# Patient Record
Sex: Female | Born: 1950 | Race: White | Hispanic: No | Marital: Married | Smoking: Never smoker
Health system: Southern US, Community
[De-identification: ages and names within clinical notes are randomized; demographics above are authoritative.]

## PROBLEM LIST (undated history)

## (undated) DIAGNOSIS — J302 Other seasonal allergic rhinitis: Secondary | ICD-10-CM

## (undated) DIAGNOSIS — T4145XA Adverse effect of unspecified anesthetic, initial encounter: Secondary | ICD-10-CM

## (undated) DIAGNOSIS — M1990 Unspecified osteoarthritis, unspecified site: Secondary | ICD-10-CM

## (undated) DIAGNOSIS — R51 Headache: Secondary | ICD-10-CM

## (undated) DIAGNOSIS — F329 Major depressive disorder, single episode, unspecified: Secondary | ICD-10-CM

## (undated) DIAGNOSIS — T8859XA Other complications of anesthesia, initial encounter: Secondary | ICD-10-CM

## (undated) DIAGNOSIS — F32A Depression, unspecified: Secondary | ICD-10-CM

## (undated) DIAGNOSIS — F419 Anxiety disorder, unspecified: Secondary | ICD-10-CM

## (undated) HISTORY — PX: BLADDER SURGERY: SHX569

## (undated) HISTORY — PX: FRACTURE SURGERY: SHX138

## (undated) HISTORY — PX: DIAGNOSTIC LAPAROSCOPY: SUR761

---
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
## 1996-02-27 HISTORY — PX: ABDOMINAL HYSTERECTOMY: SHX81

## 1997-02-26 HISTORY — PX: OVARY SURGERY: SHX727

## 1999-08-01 ENCOUNTER — Observation Stay (HOSPITAL_COMMUNITY): Admission: RE | Admit: 1999-08-01 | Discharge: 1999-08-02 | Payer: Self-pay | Admitting: Urology

## 2000-02-27 HISTORY — PX: EYE SURGERY: SHX253

## 2000-08-21 ENCOUNTER — Other Ambulatory Visit: Admission: RE | Admit: 2000-08-21 | Discharge: 2000-08-21 | Payer: Self-pay | Admitting: Obstetrics and Gynecology

## 2001-08-26 ENCOUNTER — Other Ambulatory Visit: Admission: RE | Admit: 2001-08-26 | Discharge: 2001-08-26 | Payer: Self-pay | Admitting: Obstetrics and Gynecology

## 2002-10-06 ENCOUNTER — Other Ambulatory Visit: Admission: RE | Admit: 2002-10-06 | Discharge: 2002-10-06 | Payer: Self-pay | Admitting: Obstetrics and Gynecology

## 2003-10-28 ENCOUNTER — Other Ambulatory Visit: Admission: RE | Admit: 2003-10-28 | Discharge: 2003-10-28 | Payer: Self-pay | Admitting: Obstetrics and Gynecology

## 2005-02-06 ENCOUNTER — Other Ambulatory Visit: Admission: RE | Admit: 2005-02-06 | Discharge: 2005-02-06 | Payer: Self-pay | Admitting: Obstetrics and Gynecology

## 2008-02-27 HISTORY — PX: EYE SURGERY: SHX253

## 2008-08-28 ENCOUNTER — Inpatient Hospital Stay (HOSPITAL_COMMUNITY): Admission: EM | Admit: 2008-08-28 | Discharge: 2008-09-02 | Payer: Self-pay | Admitting: Emergency Medicine

## 2008-09-15 ENCOUNTER — Ambulatory Visit (HOSPITAL_COMMUNITY): Admission: RE | Admit: 2008-09-15 | Discharge: 2008-09-16 | Payer: Self-pay | Admitting: Orthopedic Surgery

## 2010-06-04 LAB — CBC
HCT: 31.9 % — ABNORMAL LOW (ref 36.0–46.0)
Hemoglobin: 10.7 g/dL — ABNORMAL LOW (ref 12.0–15.0)
MCHC: 33.7 g/dL (ref 30.0–36.0)
MCV: 91.4 fL (ref 78.0–100.0)
Platelets: 384 10*3/uL (ref 150–400)
RBC: 3.48 MIL/uL — ABNORMAL LOW (ref 3.87–5.11)
RDW: 13.6 % (ref 11.5–15.5)
WBC: 4.4 10*3/uL (ref 4.0–10.5)

## 2010-06-04 LAB — BASIC METABOLIC PANEL
BUN: 14 mg/dL (ref 6–23)
CO2: 26 mEq/L (ref 19–32)
Calcium: 9.2 mg/dL (ref 8.4–10.5)
Chloride: 109 mEq/L (ref 96–112)
Creatinine, Ser: 0.56 mg/dL (ref 0.4–1.2)
GFR calc Af Amer: >60 mL/min (ref >60)
GFR calc non Af Amer: >60 mL/min (ref >60)
Glucose, Bld: 105 mg/dL — ABNORMAL HIGH (ref 70–99)
Potassium: 4.2 mEq/L (ref 3.5–5.1)
Sodium: 142 mEq/L (ref 135–145)

## 2010-06-05 LAB — BASIC METABOLIC PANEL
BUN: 16 mg/dL (ref 6–23)
BUN: 19 mg/dL (ref 6–23)
BUN: 3 mg/dL — ABNORMAL LOW (ref 6–23)
CO2: 21 mEq/L (ref 19–32)
CO2: 27 mEq/L (ref 19–32)
CO2: 32 mEq/L (ref 19–32)
Calcium: 8.1 mg/dL — ABNORMAL LOW (ref 8.4–10.5)
Calcium: 8.5 mg/dL (ref 8.4–10.5)
Calcium: 9.1 mg/dL (ref 8.4–10.5)
Chloride: 105 mEq/L (ref 96–112)
Chloride: 110 mEq/L (ref 96–112)
Chloride: 110 mEq/L (ref 96–112)
Creatinine, Ser: 0.53 mg/dL (ref 0.4–1.2)
Creatinine, Ser: 0.89 mg/dL (ref 0.4–1.2)
Creatinine, Ser: 1.05 mg/dL (ref 0.4–1.2)
GFR calc Af Amer: >60 mL/min (ref >60)
GFR calc Af Amer: >60 mL/min (ref >60)
GFR calc Af Amer: >60 mL/min (ref >60)
GFR calc non Af Amer: 54 mL/min — ABNORMAL LOW (ref >60)
GFR calc non Af Amer: >60 mL/min (ref >60)
GFR calc non Af Amer: >60 mL/min (ref >60)
Glucose, Bld: 113 mg/dL — ABNORMAL HIGH (ref 70–99)
Glucose, Bld: 142 mg/dL — ABNORMAL HIGH (ref 70–99)
Glucose, Bld: 212 mg/dL — ABNORMAL HIGH (ref 70–99)
Potassium: 3.6 mEq/L (ref 3.5–5.1)
Potassium: 4.1 mEq/L (ref 3.5–5.1)
Potassium: 4.3 mEq/L (ref 3.5–5.1)
Sodium: 141 mEq/L (ref 135–145)
Sodium: 145 mEq/L (ref 135–145)
Sodium: 145 mEq/L (ref 135–145)

## 2010-06-05 LAB — COMPREHENSIVE METABOLIC PANEL
ALT: 181 U/L — ABNORMAL HIGH (ref 0–35)
AST: 181 U/L — ABNORMAL HIGH (ref 0–37)
Albumin: 3.4 g/dL — ABNORMAL LOW (ref 3.5–5.2)
Alkaline Phosphatase: 48 U/L (ref 39–117)
BUN: 7 mg/dL (ref 6–23)
CO2: 31 mEq/L (ref 19–32)
Calcium: 7.9 mg/dL — ABNORMAL LOW (ref 8.4–10.5)
Chloride: 109 mEq/L (ref 96–112)
Creatinine, Ser: 0.57 mg/dL (ref 0.4–1.2)
GFR calc Af Amer: >60 mL/min (ref >60)
GFR calc non Af Amer: >60 mL/min (ref >60)
Glucose, Bld: 127 mg/dL — ABNORMAL HIGH (ref 70–99)
Potassium: 4 mEq/L (ref 3.5–5.1)
Sodium: 142 mEq/L (ref 135–145)
Total Bilirubin: 0.9 mg/dL (ref 0.3–1.2)
Total Protein: 5.9 g/dL — ABNORMAL LOW (ref 6.0–8.3)

## 2010-06-05 LAB — CBC
HCT: 22.9 % — ABNORMAL LOW (ref 36.0–46.0)
HCT: 23.6 % — ABNORMAL LOW (ref 36.0–46.0)
HCT: 24.5 % — ABNORMAL LOW (ref 36.0–46.0)
HCT: 25 % — ABNORMAL LOW (ref 36.0–46.0)
HCT: 25 % — ABNORMAL LOW (ref 36.0–46.0)
HCT: 25.3 % — ABNORMAL LOW (ref 36.0–46.0)
HCT: 26.3 % — ABNORMAL LOW (ref 36.0–46.0)
HCT: 27.5 % — ABNORMAL LOW (ref 36.0–46.0)
HCT: 27.9 % — ABNORMAL LOW (ref 36.0–46.0)
HCT: 28.4 % — ABNORMAL LOW (ref 36.0–46.0)
HCT: 28.5 % — ABNORMAL LOW (ref 36.0–46.0)
HCT: 29.5 % — ABNORMAL LOW (ref 36.0–46.0)
HCT: 35.8 % — ABNORMAL LOW (ref 36.0–46.0)
Hemoglobin: 10.1 g/dL — ABNORMAL LOW (ref 12.0–15.0)
Hemoglobin: 12.2 g/dL (ref 12.0–15.0)
Hemoglobin: 8 g/dL — ABNORMAL LOW (ref 12.0–15.0)
Hemoglobin: 8.3 g/dL — ABNORMAL LOW (ref 12.0–15.0)
Hemoglobin: 8.5 g/dL — ABNORMAL LOW (ref 12.0–15.0)
Hemoglobin: 8.5 g/dL — ABNORMAL LOW (ref 12.0–15.0)
Hemoglobin: 8.6 g/dL — ABNORMAL LOW (ref 12.0–15.0)
Hemoglobin: 8.8 g/dL — ABNORMAL LOW (ref 12.0–15.0)
Hemoglobin: 9.1 g/dL — ABNORMAL LOW (ref 12.0–15.0)
Hemoglobin: 9.4 g/dL — ABNORMAL LOW (ref 12.0–15.0)
Hemoglobin: 9.5 g/dL — ABNORMAL LOW (ref 12.0–15.0)
Hemoglobin: 9.6 g/dL — ABNORMAL LOW (ref 12.0–15.0)
Hemoglobin: 9.8 g/dL — ABNORMAL LOW (ref 12.0–15.0)
MCHC: 33.4 g/dL (ref 30.0–36.0)
MCHC: 33.6 g/dL (ref 30.0–36.0)
MCHC: 33.8 g/dL (ref 30.0–36.0)
MCHC: 34.1 g/dL (ref 30.0–36.0)
MCHC: 34.2 g/dL (ref 30.0–36.0)
MCHC: 34.5 g/dL (ref 30.0–36.0)
MCHC: 34.6 g/dL (ref 30.0–36.0)
MCHC: 34.6 g/dL (ref 30.0–36.0)
MCHC: 34.7 g/dL (ref 30.0–36.0)
MCHC: 34.8 g/dL (ref 30.0–36.0)
MCHC: 34.8 g/dL (ref 30.0–36.0)
MCHC: 35.1 g/dL (ref 30.0–36.0)
MCHC: 35.3 g/dL (ref 30.0–36.0)
MCV: 91 fL (ref 78.0–100.0)
MCV: 92 fL (ref 78.0–100.0)
MCV: 92.2 fL (ref 78.0–100.0)
MCV: 92.3 fL (ref 78.0–100.0)
MCV: 92.3 fL (ref 78.0–100.0)
MCV: 92.4 fL (ref 78.0–100.0)
MCV: 92.5 fL (ref 78.0–100.0)
MCV: 92.6 fL (ref 78.0–100.0)
MCV: 92.6 fL (ref 78.0–100.0)
MCV: 92.7 fL (ref 78.0–100.0)
MCV: 92.8 fL (ref 78.0–100.0)
MCV: 92.8 fL (ref 78.0–100.0)
MCV: 93.1 fL (ref 78.0–100.0)
Platelets: 121 10*3/uL — ABNORMAL LOW (ref 150–400)
Platelets: 140 10*3/uL — ABNORMAL LOW (ref 150–400)
Platelets: 141 10*3/uL — ABNORMAL LOW (ref 150–400)
Platelets: 144 10*3/uL — ABNORMAL LOW (ref 150–400)
Platelets: 145 10*3/uL — ABNORMAL LOW (ref 150–400)
Platelets: 162 10*3/uL (ref 150–400)
Platelets: 200 10*3/uL (ref 150–400)
Platelets: 206 10*3/uL (ref 150–400)
Platelets: 212 10*3/uL (ref 150–400)
Platelets: 219 10*3/uL (ref 150–400)
Platelets: 225 10*3/uL (ref 150–400)
Platelets: 236 10*3/uL (ref 150–400)
Platelets: DECREASED 10*3/uL (ref 150–400)
RBC: 2.48 MIL/uL — ABNORMAL LOW (ref 3.87–5.11)
RBC: 2.56 MIL/uL — ABNORMAL LOW (ref 3.87–5.11)
RBC: 2.64 MIL/uL — ABNORMAL LOW (ref 3.87–5.11)
RBC: 2.71 MIL/uL — ABNORMAL LOW (ref 3.87–5.11)
RBC: 2.71 MIL/uL — ABNORMAL LOW (ref 3.87–5.11)
RBC: 2.73 MIL/uL — ABNORMAL LOW (ref 3.87–5.11)
RBC: 2.83 MIL/uL — ABNORMAL LOW (ref 3.87–5.11)
RBC: 2.98 MIL/uL — ABNORMAL LOW (ref 3.87–5.11)
RBC: 3.02 MIL/uL — ABNORMAL LOW (ref 3.87–5.11)
RBC: 3.06 MIL/uL — ABNORMAL LOW (ref 3.87–5.11)
RBC: 3.12 MIL/uL — ABNORMAL LOW (ref 3.87–5.11)
RBC: 3.2 MIL/uL — ABNORMAL LOW (ref 3.87–5.11)
RBC: 3.86 MIL/uL — ABNORMAL LOW (ref 3.87–5.11)
RDW: 12.8 % (ref 11.5–15.5)
RDW: 12.8 % (ref 11.5–15.5)
RDW: 12.8 % (ref 11.5–15.5)
RDW: 12.8 % (ref 11.5–15.5)
RDW: 13 % (ref 11.5–15.5)
RDW: 13 % (ref 11.5–15.5)
RDW: 13 % (ref 11.5–15.5)
RDW: 13.1 % (ref 11.5–15.5)
RDW: 13.1 % (ref 11.5–15.5)
RDW: 13.2 % (ref 11.5–15.5)
RDW: 13.4 % (ref 11.5–15.5)
RDW: 13.5 % (ref 11.5–15.5)
RDW: 13.6 % (ref 11.5–15.5)
WBC: 14.2 10*3/uL — ABNORMAL HIGH (ref 4.0–10.5)
WBC: 5 10*3/uL (ref 4.0–10.5)
WBC: 5.2 10*3/uL (ref 4.0–10.5)
WBC: 5.5 10*3/uL (ref 4.0–10.5)
WBC: 5.6 10*3/uL (ref 4.0–10.5)
WBC: 5.8 10*3/uL (ref 4.0–10.5)
WBC: 5.9 10*3/uL (ref 4.0–10.5)
WBC: 6.2 10*3/uL (ref 4.0–10.5)
WBC: 7.2 10*3/uL (ref 4.0–10.5)
WBC: 7.3 10*3/uL (ref 4.0–10.5)
WBC: 7.3 10*3/uL (ref 4.0–10.5)
WBC: 7.6 10*3/uL (ref 4.0–10.5)
WBC: 8 10*3/uL (ref 4.0–10.5)

## 2010-06-05 LAB — URINALYSIS, ROUTINE W REFLEX MICROSCOPIC
Bilirubin Urine: NEGATIVE
Glucose, UA: NEGATIVE mg/dL
Ketones, ur: 15 mg/dL — AB
Nitrite: POSITIVE — AB
Protein, ur: 30 mg/dL — AB
Specific Gravity, Urine: 1.014 (ref 1.005–1.030)
Urobilinogen, UA: 0.2 mg/dL (ref 0.0–1.0)
pH: 5.5 (ref 5.0–8.0)

## 2010-06-05 LAB — HEPATIC FUNCTION PANEL
ALT: 317 U/L — ABNORMAL HIGH (ref 0–35)
AST: 597 U/L — ABNORMAL HIGH (ref 0–37)
Albumin: 3.8 g/dL (ref 3.5–5.2)
Alkaline Phosphatase: 56 U/L (ref 39–117)
Bilirubin, Direct: 0.3 mg/dL (ref 0.0–0.3)
Indirect Bilirubin: 0.3 mg/dL (ref 0.3–0.9)
Total Bilirubin: 0.6 mg/dL (ref 0.3–1.2)
Total Protein: 6.1 g/dL (ref 6.0–8.3)

## 2010-06-05 LAB — TYPE AND SCREEN
ABO/RH(D): A POS
Antibody Screen: NEGATIVE

## 2010-06-05 LAB — DIFFERENTIAL
Basophils Absolute: 0 10*3/uL (ref 0.0–0.1)
Basophils Relative: 0 % (ref 0–1)
Eosinophils Absolute: 0 10*3/uL (ref 0.0–0.7)
Eosinophils Relative: 0 % (ref 0–5)
Lymphocytes Relative: 7 % — ABNORMAL LOW (ref 12–46)
Lymphs Abs: 1 10*3/uL (ref 0.7–4.0)
Monocytes Absolute: 0.7 10*3/uL (ref 0.1–1.0)
Monocytes Relative: 5 % (ref 3–12)
Neutro Abs: 12.5 10*3/uL — ABNORMAL HIGH (ref 1.7–7.7)
Neutrophils Relative %: 88 % — ABNORMAL HIGH (ref 43–77)

## 2010-06-05 LAB — ETHANOL: Alcohol, Ethyl (B): 66 mg/dL — ABNORMAL HIGH (ref 0–10)

## 2010-06-05 LAB — PROTIME-INR
INR: 1 (ref 0.00–1.49)
Prothrombin Time: 13.6 seconds (ref 11.6–15.2)

## 2010-06-05 LAB — URINE MICROSCOPIC-ADD ON

## 2010-06-05 LAB — APTT: aPTT: <20 seconds — ABNORMAL LOW (ref 24–37)

## 2010-06-05 LAB — ACETAMINOPHEN LEVEL: Acetaminophen (Tylenol), Serum: <10 ug/mL — ABNORMAL LOW (ref 10–30)

## 2010-06-05 LAB — ABO/RH: ABO/RH(D): A POS
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
## 2010-07-11 NOTE — Op Note (Signed)
NAMEARANTZA, DARRINGTON               ACCOUNT NO.:  [id number]   MEDICAL RECORD NO.:  [id number]          PATIENT TYPE:  OIB   LOCATION:  3039                         FACILITY:  [facility]   PHYSICIAN:  Harvie Junior, M.D.   DATE OF BIRTH:  May 02, 1950   DATE OF PROCEDURE:  DATE OF DISCHARGE:  09/16/2008                               OPERATIVE REPORT   PREOPERATIVE DIAGNOSIS:  Type II distal clavicle fracture  __________disruption of the __________ clavicular ligament.   POSTOPERATIVE DIAGNOSIS:  Type II distal clavicle fracture __________  disruption of the __________ clavicular ligament.   __________ repair of the type II distal clavicle fracture by performance  of a distal clavicle resection and repair of the clavicle down to the  coracoid with __________ x2 on the __________ .   SURGEON:  Harvie Junior, MD   ANESTHESIA:  General.   HISTORY:  Ms. Ashley Kaufman is a 60 year old female had a injury about 2 weeks  ago falling down the stairs.  She suffered a type II distal clavicle  fracture.  She presents to our office, was initially found that  __________ at the acromioclavicular space __________.   PROCEDURE:  The patient was brought to the operating room.  After  adequate anesthesia was obtained with general anesthetic, the patient  was placed supine on the operating table and moved to the beach chair  position __________ scapula.  Attention was turned to the right shoulder  after prep and drape.  A curved incision was made __________ surrounding  the coracoid  __________.  At this point, we irrigated thoroughly  __________ this area and then closed the skin with 2-0 Vicryl and 3-0  Monocryl subcuticular.  Benzoin and Steri-Strips were applied.  Sterile  compression dressing was applied.  The patient was taken to the recovery  room __________.      Harvie Junior, M.D.     Ranae Plumber  D:  09/16/2008  T:  09/17/2008  Job:  [id number]
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
## 2010-07-11 NOTE — Op Note (Signed)
Ashley Kaufman, Ashley Kaufman               ACCOUNT NO.:  [id number]   MEDICAL RECORD NO.:  [id number]          PATIENT TYPE:  OIB   LOCATION:  3039                         FACILITY:  [facility]   PHYSICIAN:  Harvie Junior, M.D.   DATE OF BIRTH:  27-Mar-1950   DATE OF PROCEDURE:  09/15/2008  DATE OF DISCHARGE:  09/16/2008                               OPERATIVE REPORT   SURGEON:  Harvie Junior, MD   ASSISTANT:  Marshia Ly, PA   BRIEF HISTORY:  Ashley Kaufman is a 60 year old female with a long history  of having suffered an injury where she had fallen down the stairs.  She  suffered a fracture dislocation of her right shoulder with type 2 distal  clavicle fracture with dislocation of the AC joint.  She also suffered  bilateral wrist fractures.  We had addressed the right wrist fracture  with surgical intervention as outlined on the previous operative noted  and she was brought to the operating room for open reduction and  internal fixation of her distal clavicle fracture abd repair of AC  joint.   PROCEDURE:  The patient was taken to the operating room.  After adequate  anesthesia was obtained with general anesthetic, the patient was placed  supine on the operating table.  The right shoulder was then prepped and  draped in the usual sterile fashion.  Following this, a curved incision  was made in the skin lines and subcutaneous tissues were taken down the  level of the deltotrapezial fascia and this was divided in line with the  clavicle.  We dissected down to the fractured fragments and did a distal  clavicle excision by removal of all the remaining fractured fragments.  Following this, attention was turned towards the distal clavicle, which  was popped up quite significantly and fractured clavicle was identified  and noted to really be in a coronal planes, so there was really not a  bicortical place where we could get point of fixation of further distal  clavicle and because of this concern  we ultimately went in and passed  two #5 sutures around the coracoid and once this was done through two  different drill holes we passed #5 sutures through there.  This gave  excellent fixation of the clavicle, but because of the concerns of  inability to repair of the corporal clavicular ligaments, because of the  fracture fragment, and there being no bile ligamentous attachment to the  clavicle proper, we felt that we needed to do some sort of ligamentous  transfer.  At this point, we took down the coracoacromial ligament and  transferred that into the end of the clavicle through a #2 FiberWire  through 2 drill holes and entered the clavicle.  This gave excellent  fixation of this ligament and was quite a strong ligament to hold this  in place.  At this point, the clavicle was nicely reduced to the  coracoid.  The coracoacromial ligament had been restored into the end.  All bony fragment had been removed.  The wound was irrigated and  suctioned dry.  The  deltotrapezial fascia was closed with 1 Vicryl  running, skin with 1-0 and 2-0 Vicryl, 3-0 Monocryl subcuticular.  Benzoin and Steri-Strips were applied.  Sterile compressive dressing was  applied in an arm sling, and the patient was taken to the recovery room.  She was noted to be in satisfactory condition.  Estimated blood loss for  the procedure was none.      Harvie Junior, M.D.  Electronically Signed     JLG/MEDQ  D:  09/20/2008  T:  09/20/2008  Job:  [id number]

## 2010-07-11 NOTE — Op Note (Signed)
NAMEYARIELA, Ashley Kaufman               ACCOUNT NO.:  [id number]   MEDICAL RECORD NO.:  [id number]          PATIENT TYPE:  INP   LOCATION:  5022                         FACILITY:  [facility]   PHYSICIAN:  Johnette Abraham, MD    DATE OF BIRTH:  04/14/1950   DATE OF PROCEDURE:  08/29/2008  DATE OF DISCHARGE:                               OPERATIVE REPORT   PREOPERATIVE DIAGNOSIS:  Bilateral distal radius fractures.   POSTOPERATIVE DIAGNOSIS:  Bilateral distal radius fractures.   PROCEDURES:  1. Open reduction and internal fixation of the left distal radius with      Stryker volar plate.  2. Closed reduction and splinting of the right distal radius.   SURGEON:  Johnette Abraham, MD   ASSISTANT:  None.   ANESTHESIA:  General.   SPECIMENS:  None.   ESTIMATED BLOOD LOSS:  Minimal.   COMPLICATIONS:  No acute complications.   INDICATIONS:  [name] is a young female, who fell down some steps  the other day, sustaining bilateral distal radius fractures.  The left  was the more severe of the two with dorsal angulation.  The risks,  benefits, and alternatives of the surgery were discussed with the  patient and she agreed to proceed with the above-mentioned procedures.  Consent was obtained.   PROCEDURE IN DETAIL:  The patient was taken to the operating room and  placed supine on the operating room table.  General anesthesia was  administered.  All extremities were padded.  The left upper extremity  was prepped and draped in normal sterile fashion.  An Esmarch was used.  Tourniquet was inflated to 250 mmHg.  A volar incision was made  overlying the FCR tendon.  Dissection was carried down through the deep  fascia to the pronator quadratus muscle.  This muscle was taken down in  an L-shaped fashion exposing the fracture site.  The fracture site was  extra-articular, was easily reduced.  An appropriate size Stryker volar  plate was chosen, temporarily held in place with K-wires while x-ray  examination revealed a near anatomic reduction and good plate placement.  The radial shaft screws were drilled first.  A total of 4 screws in the  radial shaft were each drilled, measured to length, and placed.  These  were a combination of cortical and locking screws.  Following, the  radial styloid screws were drilled and placed.  X-ray examination  revealed good and near anatomic reduction, and therefore the remaining  screws were drilled, measured, and placed.  The most ulnar and distal  screw had to be changed out and redrilled due to wrong projection.  However, the final screw placement and x-ray revealed near anatomic  reduction and good plate and screw length and placement.  The wound was  irrigated with irrigation solution.  The pronator quadratus muscle was  approximated over the plate.  The deep fascia was then closed, all these  with interrupted 4-0 Vicryl.  The tourniquet was released.  All fingers  returned to nice pink color.  The skin was closed with a running 4-0  Monocryl stitch.  Xeroform and a splint were placed.  Following this,  the right extremity was addressed.  There was a slight dorsal  displacement of this fracture.  Gentle dorsal pressure reduced the  fracture nicely.  A splint was placed and the reduction was maintained  after splint placement.  The patient tolerated the procedure well,  awakened from anesthesia without difficulties.      Johnette Abraham, MD  Electronically Signed     HCC/MEDQ  D:  08/29/2008  T:  08/29/2008  Job:  [id number]

## 2010-07-11 NOTE — Op Note (Signed)
Ashley Kaufman, Ashley Kaufman               ACCOUNT NO.:  [id number]   MEDICAL RECORD NO.:  [id number]          PATIENT TYPE:  OIB   LOCATION:  3039                         FACILITY:  [facility]   PHYSICIAN:  Harvie Junior, M.D.   DATE OF BIRTH:  03/07/1950   DATE OF PROCEDURE:  DATE OF DISCHARGE:  09/16/2008                               OPERATIVE REPORT   PREOPERATIVE DIAGNOSIS:  Malunion, right distal radius.   PREOPERATIVE DIAGNOSIS:  Malunion right distal radius.   PROCEDURE:  Open reduction and internal fixation of right distal radius  malunion with a DVR plate.   SURGEON:  Harvie Junior, MD.   ASSISTANT:  Marshia Ly, P.A.   ANESTHESIA:  General.   BRIEF HISTORY:  [name] is a 60 year old female with a history of  having had a fall and suffered bilateral wrist fracture and fracture  dislocation of shoulder.,  she was treated with _closed reduction r. and  plating l.  Ultimately she came to [place] for fixation of the shoulder and  we evaluated the r. wrist fracture and found this to have a malunion and  i felt this needed to be fixed and she taken to the operating room for  fixation of this malunion.   PROCEDURE:  The patient was taken to the operating room.  After adequate  anesthesia obtained with general anesthetic, the patient was placed  supine on the operating table.  The right wrist was then prepped and  draped in usual sterile fashion.  Following this, the arm was  exsanguinated, the blood pressure tourniquet was inflated to 250 mmHg.  A curved incision was made over the flexor carpi radialis tendon. After  incision the tendon was retracted radially from the forearm and the  pronator quadratus was taken down on the radial side and the fracture  malunion was exposed.  The fracture site was identified.  The malunion  was identified and reduced. the wrist was fixed with a DVR plate fixed  in place with 7 distal pegs and proximally with 4 screws.  We achieved a  near  anatomic reduction.  The wound was irrigated and final fluoro  images were obtained and the pronator quadratus was closed with 4-0  vicryl.  The skin was closed with 4-0 Vicryl and 3-0 Monocryl  subcuticular.  Benzoin and Steri-Strips were applied. A sterile dressing  and volar splint were applied and the patient was taken to the recovery  room and was noted to be in satisfactory condition.  Estimated blood  loss for this procedure was none.      Harvie Junior, M.D.  Electronically Signed     JLG/MEDQ  D:  09/16/2008  T:  09/17/2008  Job:  [id number]   cc:   Harvie Junior, M.D.

## 2010-07-14 NOTE — Op Note (Signed)
[facility]  Patient:    Ashley Kaufman, Ashley Kaufman                      MRN: [id number] Proc. Date: 08/01/99 Adm. Date:  [id number] Disc. Date: [id number] Attending:  Evlyn Clines CC:         Guy Sandifer. Arleta Creek, M.D.             Excell Seltzer. Annabell Howells, M.D.             Soyla Murphy. Renne Crigler, M.D.                           Operative Report  PROCEDURE:  Pubovaginal sling procedure.  PREOPERATIVE DIAGNOSIS:  Type 3 stress incontinence.  POSTOPERATIVE DIAGNOSES:  Type 3 stress incontinence, with small cystocele.  SURGEON:  Excell Seltzer. Annabell Howells, M.D.  ANESTHESIA:  General.  DRAINS:  Suprapubic tube.  COMPLICATIONS:  None.  INDICATIONS:  Ashley Kaufman is a 60 year old white female sent by Dr. Huntley Dec for incontinence.  On evaluation, she was felt to have type 3 stress incontinence with minimal hypermobility, and after discussing the treatment options, she elected pubovaginal sling.  FINDINGS AND PROCEDURE:  The patient was given p.o. Cipro.  She was taken to the operating room, where a general anesthetic was induced.  She was placed in the lithotomy position, her mons was shaved.  She was prepped with Betadine solution and draped in the usual sterile fashion.  A 16 French Foley catheter was placed per urethra, and a weighted vaginal retractor was placed.  The anterior vaginal wall was infiltrated with 1% lidocaine with epinephrine.  A 2-3 cm transverse incision was made over the pubis with a knife.  Bovie was used for hemostasis, and the fat was spread with the Strully scissors down to the fascia.  An antibiotic-soaked sponge was placed in the wound.  A midline anterior vaginal wall incision was made over the bladder neck and urethra with a weighted vaginal speculum, and the patient was noted to have more cystocele than had originally been appreciated.  The vaginal wall mucosa was elevated off the pubourethral and pubovesical fascia.  The vaginal mucosa was quite friable,  pale, and tore easily, indicating significant atrophy.  Once the mucosa had been elevated on each side back to the attachment of the fascia to the pubis, the fascia was entered on the right with the Strully scissors, and a finger was used to create a tract into the retropubic space.  This was then repeated on the left.  A 2 x 8 cm _____ strip which had been soaked in antibiotic solution was then brought onto the field.  A #1 Prolene was placed at each end with a quadruple helical stitch.  Two passes of the Raz needle were then made, one on the right and one on the left, under digital guidance, and the suspension sutures were drawn into the abdominal incision.  Once the suspension sutures were placed, a 2-0 Vicryl tacking suture was placed in the midline of the _____ graft on the distal edge.  This was secured over the midurethral level.  A second tacking stitch was placed proximally over the bladder neck.  Once the strip was in position, there was still a bit of cystocele bulging proximal to this.  The vaginal mucosa was then elevated further off the pubovesical fascia.  The pubovesical fascia was then imbricated with a horizontal 2-0 Vicryl to  reduce the remaining cystocele.  At this point, the vaginal mucosa was closed using running locked 2-0 Vicryl. The closure had a T configuration because of the tear on the left aspect of the mucosa due to the significant atrophy and friability, but I was able to obtain a good, secure closure.  Once the closure had been performed, the Foley catheter was removed and the cystoscope was passed.  Inspection revealed no evidence of suture passed through the bladder, bladder trauma, or other abnormalities.  The sling material appeared to be in good position.  The bladder was filled, the patient was placed in Trendelenburg position, and a Microvasive fader tip suprapubic tube was placed through a separate stab wound approximately 3-4 cm superior to the  abdominal incision.  Once the tube was in good position, the trocar was removed, the _____ was formed and secured, and the tube was placed to straight drainage.  At this point, the bladder was drained, the cystoscope sheath was left in the bladder to provide a neutral angle for the urethra, and the suspension sutures were then tied over a hemostat to avoid excessive tension and then across the midline to each other. The long ends were cut.  The knot was tucked back into the abdominal incision, which was then irrigated with antibiotic solution.  The abdominal incision was closed using a running intracuticular 4-0 Vicryl stitch.  The wound was reinforced with Steri-Strips.  The suprapubic tube was secured with a 0 silk suture, and a two-inch Iodoform vaginal pack was placed.  A dressing was applied to the abdominal wound.  The patient was taken down from lithotomy position, her anesthetic was reversed, and she was moved to the recovery room in stable condition.  There were no complications. DD:  08/01/99 TD:  08/03/99 Job: [id number]

## 2010-08-13 IMAGING — CR DG CHEST 1V PORT
1 series · 1 of 1 positions shown · non-contrast
Comparison: Chest x-ray of 08/28/2008

CLINICAL DATA: Fell with rib fractures

PORTABLE CHEST - 1 VIEW

[AP]
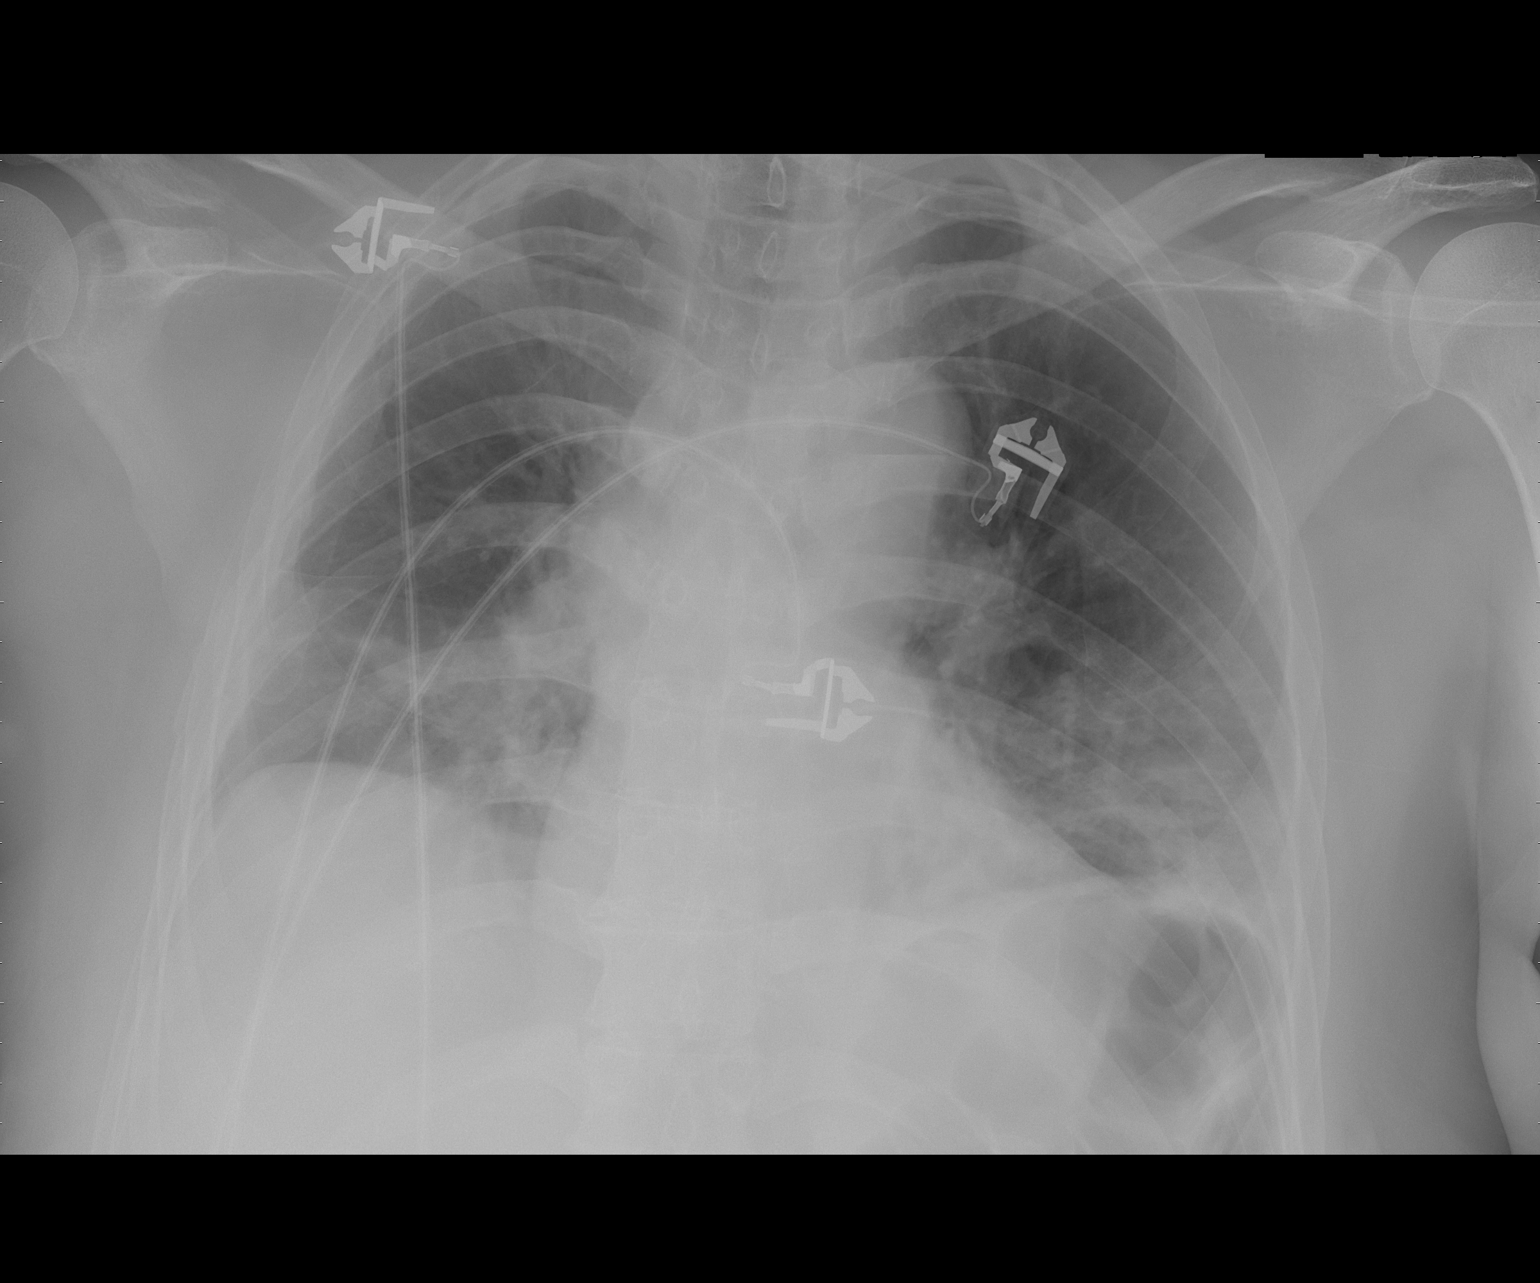

[1 of 1 positions shown; findings below may reference images not displayed]

FINDINGS: Opacities at the lung bases have increased consistent
with increasing bibasilar atelectasis.  No definite pneumothorax is
seen.  Multiple right rib fractures and distal right clavicular
fracture again are noted.  The heart is stable in size.
IMPRESSION: Increasing opacity at the lung bases most consistent with
increasing atelectasis.  Multiple right rib fractures and right
clavicular fracture are again noted.

## 2012-11-06 ENCOUNTER — Other Ambulatory Visit: Payer: Self-pay | Admitting: Orthopedic Surgery

## 2012-11-28 ENCOUNTER — Encounter (HOSPITAL_COMMUNITY): Payer: Self-pay | Admitting: Pharmacy Technician

## 2012-12-01 ENCOUNTER — Other Ambulatory Visit (HOSPITAL_COMMUNITY): Payer: Self-pay | Admitting: *Deleted

## 2012-12-01 NOTE — Pre-Procedure Instructions (Signed)
Ashley Kaufman  12/01/2012   Your procedure is scheduled on:  Friday, December 12, 2012 at 10:10 AM.   Report to [facility] Entrance "A" at 8:10 AM.   Call this number if you have problems the morning of surgery: [phone]   Remember:   Do not eat food or drink liquids after midnight Thursday, 12/11/12.   Take these medicines the morning of surgery with A SIP OF WATER: PARoxetine (PAXIL),  traMADol (ULTRAM) - if needed.   Stop all Vitamins, Herbal Medications, Fish Oil, Aspirin and NSAIDS (Mobic [Meloxicam], Ibuprofen, Motrin, Naproxen, etc.) as of 12/05/12.   Do not wear jewelry, make-up or nail polish.  Do not wear lotions, powders, or perfumes. You may wear deodorant.  Do not shave 48 hours prior to surgery.   Do not bring valuables to the hospital.  [facility] is not responsible                  for any belongings or valuables.               Contacts, dentures or bridgework may not be worn into surgery.  Leave suitcase in the car. After surgery it may be brought to your room.  For patients admitted to the hospital, discharge time is determined by your                treatment team.           Special Instructions: Shower using CHG 2 nights before surgery and the night before surgery.  If you shower the day of surgery use CHG.  Use special wash - you have one bottle of CHG for all showers.  You should use approximately 1/3 of the bottle for each shower.   Please read over the following fact sheets that you were given: Pain Booklet, Coughing and Deep Breathing, Blood Transfusion Information, MRSA Information and Surgical Site Infection Prevention

## 2012-12-02 ENCOUNTER — Encounter (HOSPITAL_COMMUNITY)
Admission: RE | Admit: 2012-12-02 | Discharge: 2012-12-02 | Disposition: A | Discharge status: Home / Self Care | Payer: 59 | Source: Ambulatory Visit | Attending: Orthopedic Surgery | Admitting: Orthopedic Surgery

## 2012-12-02 ENCOUNTER — Encounter (HOSPITAL_COMMUNITY): Payer: Self-pay

## 2012-12-02 ENCOUNTER — Ambulatory Visit (HOSPITAL_COMMUNITY)
Admission: RE | Admit: 2012-12-02 | Discharge: 2012-12-02 | Disposition: A | Discharge status: Home / Self Care | Payer: 59 | Source: Ambulatory Visit | Attending: Orthopedic Surgery | Admitting: Orthopedic Surgery

## 2012-12-02 DIAGNOSIS — Z01812 Encounter for preprocedural laboratory examination: Secondary | ICD-10-CM | POA: Insufficient documentation

## 2012-12-02 DIAGNOSIS — Z01818 Encounter for other preprocedural examination: Secondary | ICD-10-CM | POA: Insufficient documentation

## 2012-12-02 HISTORY — DX: Adverse effect of unspecified anesthetic, initial encounter: T41.45XA

## 2012-12-02 HISTORY — DX: Major depressive disorder, single episode, unspecified: F32.9

## 2012-12-02 HISTORY — DX: Unspecified osteoarthritis, unspecified site: M19.90

## 2012-12-02 HISTORY — DX: Depression, unspecified: F32.A

## 2012-12-02 HISTORY — DX: Other complications of anesthesia, initial encounter: T88.59XA

## 2012-12-02 HISTORY — DX: Headache: R51

## 2012-12-02 HISTORY — DX: Anxiety disorder, unspecified: F41.9

## 2012-12-02 HISTORY — DX: Other seasonal allergic rhinitis: J30.2

## 2012-12-02 LAB — CBC WITH DIFFERENTIAL/PLATELET
Basophils Absolute: 0 10*3/uL (ref 0.0–0.1)
Basophils Relative: 1 % (ref 0–1)
Eosinophils Absolute: 0.2 10*3/uL (ref 0.0–0.7)
Eosinophils Relative: 3 % (ref 0–5)
HCT: 38.8 % (ref 36.0–46.0)
Hemoglobin: 13 g/dL (ref 12.0–15.0)
Lymphocytes Relative: 29 % (ref 12–46)
Lymphs Abs: 1.6 10*3/uL (ref 0.7–4.0)
MCH: 30.6 pg (ref 26.0–34.0)
MCHC: 33.5 g/dL (ref 30.0–36.0)
MCV: 91.3 fL (ref 78.0–100.0)
Monocytes Absolute: 0.4 10*3/uL (ref 0.1–1.0)
Monocytes Relative: 6 % (ref 3–12)
Neutro Abs: 3.5 10*3/uL (ref 1.7–7.7)
Neutrophils Relative %: 62 % (ref 43–77)
Platelets: 240 10*3/uL (ref 150–400)
RBC: 4.25 MIL/uL (ref 3.87–5.11)
RDW: 13.1 % (ref 11.5–15.5)
WBC: 5.7 10*3/uL (ref 4.0–10.5)

## 2012-12-02 LAB — PROTIME-INR
INR: 1 (ref 0.00–1.49)
Prothrombin Time: 13 seconds (ref 11.6–15.2)

## 2012-12-02 LAB — URINALYSIS, ROUTINE W REFLEX MICROSCOPIC
Bilirubin Urine: NEGATIVE
Glucose, UA: NEGATIVE mg/dL
Hgb urine dipstick: NEGATIVE
Ketones, ur: NEGATIVE mg/dL
Leukocytes, UA: NEGATIVE
Nitrite: NEGATIVE
Protein, ur: NEGATIVE mg/dL
Specific Gravity, Urine: 1.007 (ref 1.005–1.030)
Urobilinogen, UA: 0.2 mg/dL (ref 0.0–1.0)
pH: 7.5 (ref 5.0–8.0)

## 2012-12-02 LAB — COMPREHENSIVE METABOLIC PANEL
ALT: 14 U/L (ref 0–35)
AST: 23 U/L (ref 0–37)
Albumin: 4.4 g/dL (ref 3.5–5.2)
Alkaline Phosphatase: 54 U/L (ref 39–117)
BUN: 13 mg/dL (ref 6–23)
CO2: 26 mEq/L (ref 19–32)
Calcium: 9.3 mg/dL (ref 8.4–10.5)
Chloride: 104 mEq/L (ref 96–112)
Creatinine, Ser: 0.61 mg/dL (ref 0.50–1.10)
GFR calc Af Amer: >90 mL/min (ref >90)
GFR calc non Af Amer: >90 mL/min (ref >90)
Glucose, Bld: 91 mg/dL (ref 70–99)
Potassium: 4.5 mEq/L (ref 3.5–5.1)
Sodium: 139 mEq/L (ref 135–145)
Total Bilirubin: 0.6 mg/dL (ref 0.3–1.2)
Total Protein: 7.5 g/dL (ref 6.0–8.3)

## 2012-12-02 LAB — SURGICAL PCR SCREEN
MRSA, PCR: NEGATIVE
Staphylococcus aureus: NEGATIVE

## 2012-12-02 LAB — APTT: aPTT: 27 seconds (ref 24–37)

## 2012-12-02 LAB — TYPE AND SCREEN
ABO/RH(D): A POS
Antibody Screen: NEGATIVE

## 2012-12-11 MED ORDER — CEFAZOLIN SODIUM-DEXTROSE 2-3 GM-% IV SOLR
2.0000 g | INTRAVENOUS | Status: AC
  Administered 2012-12-12: 2 g via INTRAVENOUS
  Filled 2012-12-11: qty 50

## 2012-12-12 ENCOUNTER — Inpatient Hospital Stay (HOSPITAL_COMMUNITY)
Admission: RE | Admit: 2012-12-12 | Discharge: 2012-12-14 | DRG: 470 | Disposition: A | Discharge status: Home Health | Payer: 59 | Source: Ambulatory Visit | Attending: Orthopedic Surgery | Admitting: Orthopedic Surgery

## 2012-12-12 ENCOUNTER — Inpatient Hospital Stay (HOSPITAL_COMMUNITY): Payer: 59

## 2012-12-12 ENCOUNTER — Other Ambulatory Visit: Payer: Self-pay

## 2012-12-12 ENCOUNTER — Encounter (HOSPITAL_COMMUNITY)
Admission: RE | Disposition: A | Discharge status: Home Health | Payer: Self-pay | Source: Ambulatory Visit | Attending: Orthopedic Surgery

## 2012-12-12 ENCOUNTER — Encounter (HOSPITAL_COMMUNITY): Payer: 59 | Admitting: Anesthesiology

## 2012-12-12 ENCOUNTER — Encounter (HOSPITAL_COMMUNITY): Payer: Self-pay | Admitting: Certified Registered Nurse Anesthetist

## 2012-12-12 ENCOUNTER — Inpatient Hospital Stay (HOSPITAL_COMMUNITY): Payer: 59 | Admitting: Anesthesiology

## 2012-12-12 DIAGNOSIS — Z7982 Long term (current) use of aspirin: Secondary | ICD-10-CM

## 2012-12-12 DIAGNOSIS — Z79899 Other long term (current) drug therapy: Secondary | ICD-10-CM

## 2012-12-12 DIAGNOSIS — M1610 Unilateral primary osteoarthritis, unspecified hip: Principal | ICD-10-CM | POA: Diagnosis present

## 2012-12-12 DIAGNOSIS — F411 Generalized anxiety disorder: Secondary | ICD-10-CM | POA: Diagnosis present

## 2012-12-12 DIAGNOSIS — Z9089 Acquired absence of other organs: Secondary | ICD-10-CM

## 2012-12-12 DIAGNOSIS — Z23 Encounter for immunization: Secondary | ICD-10-CM

## 2012-12-12 DIAGNOSIS — M169 Osteoarthritis of hip, unspecified: Principal | ICD-10-CM | POA: Diagnosis present

## 2012-12-12 DIAGNOSIS — F329 Major depressive disorder, single episode, unspecified: Secondary | ICD-10-CM | POA: Diagnosis present

## 2012-12-12 DIAGNOSIS — M1611 Unilateral primary osteoarthritis, right hip: Secondary | ICD-10-CM

## 2012-12-12 DIAGNOSIS — F3289 Other specified depressive episodes: Secondary | ICD-10-CM | POA: Diagnosis present

## 2012-12-12 HISTORY — PX: TOTAL HIP ARTHROPLASTY: SHX124

## 2012-12-12 SURGERY — ARTHROPLASTY, HIP, TOTAL, ANTERIOR APPROACH
Anesthesia: General | Site: Hip | Laterality: Right | Wound class: Clean

## 2012-12-12 MED ORDER — METHOCARBAMOL 750 MG PO TABS: 750.0000 mg | ORAL_TABLET | Freq: Three times a day (TID) | ORAL | Status: AC

## 2012-12-12 MED ORDER — CEFAZOLIN SODIUM-DEXTROSE 2-3 GM-% IV SOLR
2.0000 g | Freq: Four times a day (QID) | INTRAVENOUS | Status: AC
  Administered 2012-12-12 (×2): 2 g via INTRAVENOUS
  Filled 2012-12-12 (×2): qty 50

## 2012-12-12 MED ORDER — ACETAMINOPHEN 325 MG PO TABS
650.0000 mg | ORAL_TABLET | Freq: Four times a day (QID) | ORAL | Status: DC | PRN
  Filled 2012-12-12: qty 2

## 2012-12-12 MED ORDER — ZOLPIDEM TARTRATE 5 MG PO TABS: 5.0000 mg | ORAL_TABLET | Freq: Every evening | ORAL | Status: DC | PRN

## 2012-12-12 MED ORDER — TRANEXAMIC ACID 100 MG/ML IV SOLN
1000.0000 mg | INTRAVENOUS | Status: DC
  Filled 2012-12-12: qty 10

## 2012-12-12 MED ORDER — PROMETHAZINE HCL 25 MG/ML IJ SOLN: 6.2500 mg | INTRAMUSCULAR | Status: DC | PRN

## 2012-12-12 MED ORDER — MIDAZOLAM HCL 5 MG/5ML IJ SOLN
INTRAMUSCULAR | Status: DC | PRN
  Administered 2012-12-12 (×2): 2 mg via INTRAVENOUS

## 2012-12-12 MED ORDER — HYDROMORPHONE HCL PF 1 MG/ML IJ SOLN
1.0000 mg | INTRAMUSCULAR | Status: DC | PRN
  Administered 2012-12-12 – 2012-12-13 (×3): 1 mg via INTRAVENOUS
  Filled 2012-12-12 (×3): qty 1

## 2012-12-12 MED ORDER — DIPHENHYDRAMINE HCL 12.5 MG/5ML PO ELIX: 12.5000 mg | ORAL_SOLUTION | ORAL | Status: DC | PRN

## 2012-12-12 MED ORDER — KETOROLAC TROMETHAMINE 30 MG/ML IJ SOLN
INTRAMUSCULAR | Status: DC | PRN
  Administered 2012-12-12: 30 mg via INTRAVENOUS

## 2012-12-12 MED ORDER — DEXAMETHASONE SODIUM PHOSPHATE 10 MG/ML IJ SOLN
INTRAMUSCULAR | Status: DC | PRN
  Administered 2012-12-12: 10 mg via INTRAVENOUS

## 2012-12-12 MED ORDER — LACTATED RINGERS IV SOLN
INTRAVENOUS | Status: DC
  Administered 2012-12-12: 09:00:00 via INTRAVENOUS

## 2012-12-12 MED ORDER — BUPIVACAINE-EPINEPHRINE PF 0.5-1:200000 % IJ SOLN
INTRAMUSCULAR | Status: DC | PRN
  Administered 2012-12-12: 20 mL

## 2012-12-12 MED ORDER — PAROXETINE HCL 20 MG PO TABS: 40.0000 mg | ORAL_TABLET | Freq: Every day | ORAL | Status: DC

## 2012-12-12 MED ORDER — FENTANYL CITRATE 0.05 MG/ML IJ SOLN
INTRAMUSCULAR | Status: DC | PRN
  Administered 2012-12-12: 100 ug via INTRAVENOUS
  Administered 2012-12-12 (×5): 50 ug via INTRAVENOUS
  Administered 2012-12-12: 150 ug via INTRAVENOUS

## 2012-12-12 MED ORDER — TRANEXAMIC ACID 100 MG/ML IV SOLN
1000.0000 mg | INTRAVENOUS | Status: AC
  Administered 2012-12-12: 1000 mg via INTRAVENOUS

## 2012-12-12 MED ORDER — DEXAMETHASONE SODIUM PHOSPHATE 10 MG/ML IJ SOLN
10.0000 mg | Freq: Three times a day (TID) | INTRAMUSCULAR | Status: AC
  Filled 2012-12-12 (×3): qty 1

## 2012-12-12 MED ORDER — HYDROMORPHONE HCL PF 1 MG/ML IJ SOLN
INTRAMUSCULAR | Status: AC
  Filled 2012-12-12: qty 1

## 2012-12-12 MED ORDER — PROMETHAZINE HCL 25 MG/ML IJ SOLN
12.5000 mg | Freq: Four times a day (QID) | INTRAMUSCULAR | Status: DC | PRN
  Filled 2012-12-12: qty 1

## 2012-12-12 MED ORDER — ASPIRIN EC 325 MG PO TBEC: 325.0000 mg | DELAYED_RELEASE_TABLET | Freq: Two times a day (BID) | ORAL | Status: AC

## 2012-12-12 MED ORDER — POLYETHYLENE GLYCOL 3350 17 G PO PACK: 17.0000 g | PACK | Freq: Every day | ORAL | Status: DC | PRN

## 2012-12-12 MED ORDER — 0.9 % SODIUM CHLORIDE (POUR BTL) OPTIME
TOPICAL | Status: DC | PRN
  Administered 2012-12-12: 1000 mL

## 2012-12-12 MED ORDER — MENTHOL 3 MG MT LOZG: 1.0000 | LOZENGE | OROMUCOSAL | Status: DC | PRN

## 2012-12-12 MED ORDER — METHOCARBAMOL 100 MG/ML IJ SOLN
500.0000 mg | Freq: Four times a day (QID) | INTRAVENOUS | Status: DC | PRN
  Filled 2012-12-12: qty 5

## 2012-12-12 MED ORDER — SODIUM CHLORIDE 0.9 % IV SOLN
INTRAVENOUS | Status: DC
  Administered 2012-12-12: 15:00:00 via INTRAVENOUS

## 2012-12-12 MED ORDER — ROCURONIUM BROMIDE 100 MG/10ML IV SOLN
INTRAVENOUS | Status: DC | PRN
  Administered 2012-12-12: 5 mg via INTRAVENOUS
  Administered 2012-12-12: 50 mg via INTRAVENOUS

## 2012-12-12 MED ORDER — OXYCODONE HCL 5 MG PO TABS
ORAL_TABLET | ORAL | Status: AC
  Filled 2012-12-12: qty 1

## 2012-12-12 MED ORDER — ONDANSETRON HCL 4 MG/2ML IJ SOLN
INTRAMUSCULAR | Status: DC | PRN
  Administered 2012-12-12: 4 mg via INTRAMUSCULAR

## 2012-12-12 MED ORDER — KETOROLAC TROMETHAMINE 15 MG/ML IJ SOLN
15.0000 mg | Freq: Four times a day (QID) | INTRAMUSCULAR | Status: AC
  Administered 2012-12-12 – 2012-12-13 (×4): 15 mg via INTRAVENOUS
  Filled 2012-12-12 (×4): qty 1

## 2012-12-12 MED ORDER — LIDOCAINE HCL (CARDIAC) 20 MG/ML IV SOLN
INTRAVENOUS | Status: DC | PRN
  Administered 2012-12-12: 20 mg via INTRAVENOUS

## 2012-12-12 MED ORDER — PHENYLEPHRINE HCL 10 MG/ML IJ SOLN
INTRAMUSCULAR | Status: DC | PRN
  Administered 2012-12-12: 80 ug via INTRAVENOUS
  Administered 2012-12-12: 40 ug via INTRAVENOUS
  Administered 2012-12-12 (×2): 80 ug via INTRAVENOUS

## 2012-12-12 MED ORDER — OXYCODONE HCL 5 MG PO TABS: 5.0000 mg | ORAL_TABLET | Freq: Once | ORAL | Status: DC | PRN

## 2012-12-12 MED ORDER — EPHEDRINE SULFATE 50 MG/ML IJ SOLN
INTRAMUSCULAR | Status: DC | PRN
  Administered 2012-12-12 (×3): 10 mg via INTRAVENOUS

## 2012-12-12 MED ORDER — BUPIVACAINE-EPINEPHRINE (PF) 0.5% -1:200000 IJ SOLN
INTRAMUSCULAR | Status: AC
  Filled 2012-12-12: qty 10

## 2012-12-12 MED ORDER — GLYCOPYRROLATE 0.2 MG/ML IJ SOLN
INTRAMUSCULAR | Status: DC | PRN
  Administered 2012-12-12: 0.4 mg via INTRAVENOUS

## 2012-12-12 MED ORDER — PHENOL 1.4 % MT LIQD: 1.0000 | OROMUCOSAL | Status: DC | PRN

## 2012-12-12 MED ORDER — DOCUSATE SODIUM 100 MG PO CAPS
100.0000 mg | ORAL_CAPSULE | Freq: Two times a day (BID) | ORAL | Status: DC
  Administered 2012-12-12 – 2012-12-14 (×4): 100 mg via ORAL
  Filled 2012-12-12 (×6): qty 1

## 2012-12-12 MED ORDER — OXYCODONE-ACETAMINOPHEN 5-325 MG PO TABS: 1.0000 | ORAL_TABLET | Freq: Four times a day (QID) | ORAL | Status: AC | PRN

## 2012-12-12 MED ORDER — ALUM & MAG HYDROXIDE-SIMETH 200-200-20 MG/5ML PO SUSP: 30.0000 mL | ORAL | Status: DC | PRN

## 2012-12-12 MED ORDER — METHOCARBAMOL 500 MG PO TABS
ORAL_TABLET | ORAL | Status: AC
  Filled 2012-12-12: qty 1

## 2012-12-12 MED ORDER — METHOCARBAMOL 500 MG PO TABS
500.0000 mg | ORAL_TABLET | Freq: Four times a day (QID) | ORAL | Status: DC | PRN
  Administered 2012-12-12 – 2012-12-14 (×8): 500 mg via ORAL
  Filled 2012-12-12 (×9): qty 1

## 2012-12-12 MED ORDER — HYDROMORPHONE HCL PF 1 MG/ML IJ SOLN
0.2500 mg | INTRAMUSCULAR | Status: DC | PRN
  Administered 2012-12-12 (×2): 0.5 mg via INTRAVENOUS

## 2012-12-12 MED ORDER — NEOSTIGMINE METHYLSULFATE 1 MG/ML IJ SOLN
INTRAMUSCULAR | Status: DC | PRN
  Administered 2012-12-12: 3 mg via INTRAVENOUS

## 2012-12-12 MED ORDER — ONDANSETRON HCL 4 MG PO TABS: 4.0000 mg | ORAL_TABLET | Freq: Four times a day (QID) | ORAL | Status: DC | PRN

## 2012-12-12 MED ORDER — DEXAMETHASONE 4 MG PO TABS
10.0000 mg | ORAL_TABLET | Freq: Three times a day (TID) | ORAL | Status: AC
  Administered 2012-12-12 – 2012-12-13 (×3): 10 mg via ORAL
  Filled 2012-12-12 (×3): qty 1

## 2012-12-12 MED ORDER — ASPIRIN EC 325 MG PO TBEC
325.0000 mg | DELAYED_RELEASE_TABLET | Freq: Two times a day (BID) | ORAL | Status: DC
  Administered 2012-12-12 – 2012-12-14 (×4): 325 mg via ORAL
  Filled 2012-12-12 (×6): qty 1

## 2012-12-12 MED ORDER — LACTATED RINGERS IV SOLN
INTRAVENOUS | Status: DC | PRN
  Administered 2012-12-12 (×3): via INTRAVENOUS

## 2012-12-12 MED ORDER — ACETAMINOPHEN 650 MG RE SUPP: 650.0000 mg | Freq: Four times a day (QID) | RECTAL | Status: DC | PRN

## 2012-12-12 MED ORDER — OXYCODONE-ACETAMINOPHEN 5-325 MG PO TABS
1.0000 | ORAL_TABLET | ORAL | Status: DC | PRN
  Administered 2012-12-12 – 2012-12-14 (×10): 2 via ORAL
  Filled 2012-12-12 (×10): qty 2

## 2012-12-12 MED ORDER — OXYCODONE HCL 5 MG/5ML PO SOLN: 5.0000 mg | Freq: Once | ORAL | Status: DC | PRN

## 2012-12-12 MED ORDER — MEPERIDINE HCL 25 MG/ML IJ SOLN: 6.2500 mg | INTRAMUSCULAR | Status: DC | PRN

## 2012-12-12 MED ORDER — ONDANSETRON HCL 4 MG/2ML IJ SOLN: 4.0000 mg | Freq: Four times a day (QID) | INTRAMUSCULAR | Status: DC | PRN

## 2012-12-12 MED ORDER — HYDROMORPHONE HCL PF 1 MG/ML IJ SOLN
0.2500 mg | INTRAMUSCULAR | Status: DC | PRN
  Administered 2012-12-12: 0.5 mg via INTRAVENOUS

## 2012-12-12 MED ORDER — PAROXETINE HCL 20 MG PO TABS
40.0000 mg | ORAL_TABLET | Freq: Every day | ORAL | Status: DC
  Administered 2012-12-12 – 2012-12-13 (×2): 40 mg via ORAL
  Filled 2012-12-12 (×3): qty 2

## 2012-12-12 MED ORDER — MIDAZOLAM HCL 2 MG/2ML IJ SOLN: 0.5000 mg | Freq: Once | INTRAMUSCULAR | Status: DC | PRN

## 2012-12-12 MED ORDER — PROPOFOL 10 MG/ML IV BOLUS
INTRAVENOUS | Status: DC | PRN
  Administered 2012-12-12: 130 mg via INTRAVENOUS

## 2012-12-12 SURGICAL SUPPLY — 50 items
BANDAGE GAUZE ELAST BULKY 4 IN (GAUZE/BANDAGES/DRESSINGS) IMPLANT
BLADE SAW SGTL 18X1.27X75 (BLADE) ×2 IMPLANT
BLADE SURG ROTATE 9660 (MISCELLANEOUS) IMPLANT
BNDG COHESIVE 6X5 TAN STRL LF (GAUZE/BANDAGES/DRESSINGS) IMPLANT
CAPT HIP PF COP ×1 IMPLANT
CELLS DAT CNTRL 66122 CELL SVR (MISCELLANEOUS) ×1 IMPLANT
CLOTH BEACON ORANGE TIMEOUT ST (SAFETY) ×2 IMPLANT
COVER BACK TABLE 24X17X13 BIG (DRAPES) IMPLANT
COVER SURGICAL LIGHT HANDLE (MISCELLANEOUS) ×2 IMPLANT
DRAPE C-ARM 42X72 X-RAY (DRAPES) ×2 IMPLANT
DRAPE STERI IOBAN 125X83 (DRAPES) ×2 IMPLANT
DRAPE U-SHAPE 47X51 STRL (DRAPES) ×6 IMPLANT
DRSG MEPILEX BORDER 4X8 (GAUZE/BANDAGES/DRESSINGS) ×2 IMPLANT
DURAPREP 26ML APPLICATOR (WOUND CARE) ×2 IMPLANT
ELECT BLADE 4.0 EZ CLEAN MEGAD (MISCELLANEOUS) ×2
ELECT BLADE TIP CTD 4 INCH (ELECTRODE) ×1 IMPLANT
ELECT CAUTERY BLADE 6.4 (BLADE) ×1 IMPLANT
ELECT REM PT RETURN 9FT ADLT (ELECTROSURGICAL) ×2
ELECTRODE BLDE 4.0 EZ CLN MEGD (MISCELLANEOUS) IMPLANT
ELECTRODE REM PT RTRN 9FT ADLT (ELECTROSURGICAL) ×1 IMPLANT
GAUZE XEROFORM 1X8 LF (GAUZE/BANDAGES/DRESSINGS) ×1 IMPLANT
GLOVE BIOGEL PI IND STRL 8 (GLOVE) ×2 IMPLANT
GLOVE BIOGEL PI INDICATOR 8 (GLOVE) ×2
GLOVE ECLIPSE 7.5 STRL STRAW (GLOVE) ×4 IMPLANT
GOWN STRL NON-REIN LRG LVL3 (GOWN DISPOSABLE) ×4 IMPLANT
GOWN STRL REIN XL XLG (GOWN DISPOSABLE) ×4 IMPLANT
HOOD PEEL AWAY FACE SHEILD DIS (HOOD) ×7 IMPLANT
KIT BASIN OR (CUSTOM PROCEDURE TRAY) ×2 IMPLANT
KIT ROOM TURNOVER OR (KITS) ×2 IMPLANT
MANIFOLD NEPTUNE II (INSTRUMENTS) ×2 IMPLANT
NS IRRIG 1000ML POUR BTL (IV SOLUTION) ×2 IMPLANT
PACK TOTAL JOINT (CUSTOM PROCEDURE TRAY) ×2 IMPLANT
PAD ARMBOARD 7.5X6 YLW CONV (MISCELLANEOUS) ×4 IMPLANT
RETRACTOR WND ALEXIS 18 MED (MISCELLANEOUS) ×1 IMPLANT
RTRCTR WOUND ALEXIS 18CM MED (MISCELLANEOUS) ×2
SPONGE LAP 18X18 X RAY DECT (DISPOSABLE) IMPLANT
STAPLER VISISTAT 35W (STAPLE) IMPLANT
STRIP CLOSURE SKIN 1/2X4 (GAUZE/BANDAGES/DRESSINGS) ×1 IMPLANT
SUT ETHIBOND NAB CT1 #1 30IN (SUTURE) ×2 IMPLANT
SUT MNCRL AB 3-0 PS2 18 (SUTURE) IMPLANT
SUT VIC AB 0 CT1 27 (SUTURE) ×4
SUT VIC AB 0 CT1 27XBRD ANBCTR (SUTURE) ×1 IMPLANT
SUT VIC AB 1 CT1 27 (SUTURE) ×6
SUT VIC AB 1 CT1 27XBRD ANBCTR (SUTURE) ×4 IMPLANT
SUT VIC AB 2-0 CT1 27 (SUTURE) ×2
SUT VIC AB 2-0 CT1 TAPERPNT 27 (SUTURE) ×1 IMPLANT
TOWEL OR 17X24 6PK STRL BLUE (TOWEL DISPOSABLE) ×2 IMPLANT
TOWEL OR 17X26 10 PK STRL BLUE (TOWEL DISPOSABLE) ×2 IMPLANT
TRAY FOLEY CATH 16FRSI W/METER (SET/KITS/TRAYS/PACK) ×1 IMPLANT
WATER STERILE IRR 1000ML POUR (IV SOLUTION) ×4 IMPLANT

## 2012-12-12 NOTE — Op Note (Signed)
NAMEKENLEI, Ashley Kaufman               ACCOUNT NO.:  [id number]  MEDICAL RECORD NO.:  [id number]  LOCATION:  [facility]                         FACILITY:  [facility]  PHYSICIAN:  Harvie Junior, M.D.   DATE OF BIRTH:  03-03-1950  DATE OF PROCEDURE:  12/12/2012 DATE OF DISCHARGE:                              OPERATIVE REPORT   PREOPERATIVE DIAGNOSIS:  End-stage degenerative joint disease, right hip.  POSTOPERATIVE DIAGNOSIS:  End-stage degenerative joint disease, right hip.  PROCEDURE:  Right total hip replacement with a Corail size 12 stem, a 52 mm sector Gription cup and a +0 delta ceramic hip ball, 36 mm with a +4 neutral polyethylene liner.  SURGEON:  Harvie Junior, M.D.  ASSISTANT:  Marshia Ly, P.A.  ANESTHESIA:  General.  BRIEF HISTORY:  Ashley Kaufman is a 62 year old female with a history having significant complaints of right hip pain.  X-rays showed bone-on- bone changes.  She attempted conservative care for a period of time with injection therapy, activity modification, but after failure of all conservative care, she was taken to operating room for right total hip replacement.  Because of her active lifestyle, small size, and young age, we felt that an anterior hip replacement was appropriate.  She was brought to the operating room for this procedure.  DESCRIPTION OF PROCEDURE:  The patient was brought to the operating room.  After adequate anesthesia was obtained with general anesthetic, the patient was placed supine on the operating table.  She had moved into the Hana bed and preliminary x-rays of the hip and pelvis were taken.  At this point, the right hip was prepped and draped in usual sterile fashion.  Following this, an incision was made along the body of the tensor and subcutaneous tissues to the level of the tensor fascia. The tensor fascia was identified and divided and then finger dissection was used to get down on to the hip itself.  Retractors were put in  place and the vessels were then cauterized allowing access to the hip.  At this point, the hip capsule was opened from the acetabulum down to the shoulder and this was taken anteriorly and then sutures were placed into the capsule anteriorly and posteriorly.  A corkscrew was then placed into the head and the head was dislocated.  Following this, the hip was put back in place.  Provisional neck cut was made, and the attention was then turned towards the acetabulum.  Once the acetabulum was exposed with the hip and 50 degrees of external rotation and slight traction, retractors were put in place anterior and posterior.  She was sequentially reamed to a level of 51 and 52 sector Gription cup was placed with the holes on the inferior position.  Excellent fixation was achieved with a cup.  There were some beads showing at the lateral margin.  At this point, attention was turned towards placing a +4 neutral liner, and attention was then turned to the stem side, the hip was internally rotated to the neutral.  The arm for the Hana bed was placed.  The hip was then externally rotated to 100 and put it down and over position, and then elevated with the  traction device.  Once this was done, the lateral neck was rongeured aggressively and then sequential rasping was undertaken up to a level of 12, and a trial reduction was taken with a +0 ball, was pretty long at that point, went ahead back with a 11, did a calcar planer, went back with the 12, and put in a minus ball.  Rechecked the length and at that point, we looked a little short, so we went with a +0 ball, looked it on, this point the trials were removed.  The final Corail stem was placed size 12, care being taken to keep the stem out of any kind of varus alignment.  Once this was done, a delta ceramic +0 hip ball was placed and a trial reduction was undertaken.  Again stable as it was with the trials with external rotation of 60 and hip flexion  of 30, and at this point, the wound was copiously and thoroughly irrigated, suctioned dry.  The hip capsule was then closed with interrupted 0 Vicryl sutures and then this was closed to the shoulder getting a nice anterior capsular repair.  The tensor fascia was then closed with 1 Vicryl running, and the couple of fatty tissues were tacked down to the tensor fascia, and then the tensor fascia was closed with 2-0 Vicryl, and 3-0 Monocryl subcuticular. Benzoin and Steri-Strips were applied.  Sterile compressive dressing was applied, and the patient taken to recovery room.  She was noted to be in satisfactory condition.  Estimated blood loss for the procedure was 600.     Harvie Junior, M.D.     Ranae Plumber  D:  12/12/2012  T:  12/12/2012  Job:  [id number]

## 2012-12-12 NOTE — H&P (Signed)
TOTAL HIP ADMISSION H&P  Patient is admitted for right total hip arthroplasty.  Subjective:  Chief Complaint: right hip pain  HPI: Ashley Kaufman, 62 y.o. female, has a history of pain and functional disability in the right hip(s) due to arthritis and patient has failed non-surgical conservative treatments for greater than 12 weeks to include NSAID's and/or analgesics, corticosteriod injections, viscosupplementation injections, supervised PT with diminished ADL's post treatment, use of assistive devices and activity modification.  Onset of symptoms was abrupt starting 1 years ago with rapidlly worsening course since that time.The patient noted no past surgery on the right hip(s).  Patient currently rates pain in the right hip at 8 out of 10 with activity. Patient has night pain, worsening of pain with activity and weight bearing, trendelenberg gait, pain that interfers with activities of daily living, pain with passive range of motion, crepitus and joint swelling. Patient has evidence of periarticular osteophytes, joint subluxation and joint space narrowing by imaging studies. This condition presents safety issues increasing the risk of falls. This patient has had failure of all conservative care.  There is no current active infection.  There are no active problems to display for this patient.  Past Medical History  Diagnosis Date  . Complication of anesthesia     remember waking up during surgery  . Anxiety   . Depression   . Seasonal allergies Sept last 2 weeks    cold for 2 weeks, took OTC meds  . Headache(784.0)     sinus  . Arthritis     Past Surgical History  Procedure Laterality Date  . Abdominal hysterectomy  1998  . Ovary surgery  1999    right ovary removed  . Bladder surgery      as a child  . Appendectomy  1960    ruptured  . Diagnostic laparoscopy    . Fracture surgery Right     shoulder and both wrist  . Eye surgery Bilateral 2002    lasik  . Eye surgery Right  2010    detached retina    Prescriptions prior to admission  Medication Sig Dispense Refill  . Bilberry, Vaccinium myrtillus, (BILBERRY PO) Take 250 mg by mouth daily with supper.      . Cholecalciferol (VITAMIN D) 2000 UNITS tablet Take 2,000 Units by mouth at bedtime.      Marland Kitchen estradiol (ESTRACE) 0.1 MG/GM vaginal cream Place 2 g vaginally 2 (two) times a week. Sunday and Wednesday      . Glucosamine-Chondroitin (MOVE FREE PO) Take 1 tablet by mouth 2 (two) times daily.      . meloxicam (MOBIC) 15 MG tablet Take 15 mg by mouth daily as needed for pain.      . Multiple Vitamin (MULTIVITAMIN WITH MINERALS) TABS tablet Take 1 tablet by mouth daily.      Marland Kitchen OVER THE COUNTER MEDICATION Take 1 tablet by mouth daily with supper. Tru-nature Vision complex with 25 mg lutein      . PARoxetine (PAXIL) 40 MG tablet Take 40 mg by mouth at bedtime.      Marland Kitchen Propylene Glycol (SYSTANE BALANCE OP) Place 1 drop into both eyes daily.      . traMADol (ULTRAM) 50 MG tablet Take 50 mg by mouth daily as needed for pain.       No Known Allergies  History  Substance Use Topics  . Smoking status: Never Smoker   . Smokeless tobacco: Never Used  . Alcohol Use: Yes  Comment: occasional    No family history on file.   ROS ROS: I have reviewed the patient's review of systems thoroughly and there are no positive responses as relates to the HPI. Objective:  Physical Exam  Vital signs in last 24 hours: Temp:  [97 F (36.1 C)] 97 F (36.1 C) (10/17 0820) Pulse Rate:  [71] 71 (10/17 0820) Resp:  [20] 20 (10/17 0820) BP: (120)/(64) 120/64 mmHg (10/17 0820) SpO2:  [100 %] 100 % (10/17 0820) Well-developed well-nourished patient in no acute distress. Alert and oriented x3 HEENT:within normal limits Cardiac: Regular rate and rhythm Pulmonary: Lungs clear to auscultation Abdomen: Soft and nontender.  Normal active bowel sounds  Musculoskeletal:r hip painful rom //limited ir and flexion Labs: Recent Results  (from the past 2160 hour(s))  SURGICAL PCR SCREEN     Status: None   Collection Time    12/02/12  8:52 AM      Result Value Range   MRSA, PCR NEGATIVE  NEGATIVE   Staphylococcus aureus NEGATIVE  NEGATIVE   Comment:            The Xpert SA Assay (FDA     approved for NASAL specimens     in patients over 18 years of age),     is one component of     a comprehensive surveillance     program.  Test performance has     been validated by [employer] for patients greater     than or equal to 43 year old.     It is not intended     to diagnose infection nor to     guide or monitor treatment.  URINALYSIS, ROUTINE W REFLEX MICROSCOPIC     Status: None   Collection Time    12/02/12  8:52 AM      Result Value Range   Color, Urine YELLOW  YELLOW   APPearance CLEAR  CLEAR   Specific Gravity, Urine 1.007  1.005 - 1.030   pH 7.5  5.0 - 8.0   Glucose, UA NEGATIVE  NEGATIVE mg/dL   Hgb urine dipstick NEGATIVE  NEGATIVE   Bilirubin Urine NEGATIVE  NEGATIVE   Ketones, ur NEGATIVE  NEGATIVE mg/dL   Protein, ur NEGATIVE  NEGATIVE mg/dL   Urobilinogen, UA 0.2  0.0 - 1.0 mg/dL   Nitrite NEGATIVE  NEGATIVE   Leukocytes, UA NEGATIVE  NEGATIVE   Comment: MICROSCOPIC NOT DONE ON URINES WITH NEGATIVE PROTEIN, BLOOD, LEUKOCYTES, NITRITE, OR GLUCOSE <1000 mg/dL.  APTT     Status: None   Collection Time    12/02/12  8:53 AM      Result Value Range   aPTT 27  24 - 37 seconds  CBC WITH DIFFERENTIAL     Status: None   Collection Time    12/02/12  8:53 AM      Result Value Range   WBC 5.7  4.0 - 10.5 K/uL   RBC 4.25  3.87 - 5.11 MIL/uL   Hemoglobin 13.0  12.0 - 15.0 g/dL   HCT 16.1  09.6 - 04.5 %   MCV 91.3  78.0 - 100.0 fL   MCH 30.6  26.0 - 34.0 pg   MCHC 33.5  30.0 - 36.0 g/dL   RDW 40.9  81.1 - 91.4 %   Platelets 240  150 - 400 K/uL   Neutrophils Relative % 62  43 - 77 %   Neutro Abs 3.5  1.7 - 7.7  K/uL   Lymphocytes Relative 29  12 - 46 %   Lymphs Abs 1.6  0.7 - 4.0 K/uL   Monocytes  Relative 6  3 - 12 %   Monocytes Absolute 0.4  0.1 - 1.0 K/uL   Eosinophils Relative 3  0 - 5 %   Eosinophils Absolute 0.2  0.0 - 0.7 K/uL   Basophils Relative 1  0 - 1 %   Basophils Absolute 0.0  0.0 - 0.1 K/uL  COMPREHENSIVE METABOLIC PANEL     Status: None   Collection Time    12/02/12  8:53 AM      Result Value Range   Sodium 139  135 - 145 mEq/L   Potassium 4.5  3.5 - 5.1 mEq/L   Chloride 104  96 - 112 mEq/L   CO2 26  19 - 32 mEq/L   Glucose, Bld 91  70 - 99 mg/dL   BUN 13  6 - 23 mg/dL   Creatinine, Ser 0.96  0.50 - 1.10 mg/dL   Calcium 9.3  8.4 - 04.5 mg/dL   Total Protein 7.5  6.0 - 8.3 g/dL   Albumin 4.4  3.5 - 5.2 g/dL   AST 23  0 - 37 U/L   ALT 14  0 - 35 U/L   Alkaline Phosphatase 54  39 - 117 U/L   Total Bilirubin 0.6  0.3 - 1.2 mg/dL   GFR calc non Af Amer >90  >90 mL/min   GFR calc Af Amer >90  >90 mL/min   Comment: (NOTE)     The eGFR has been calculated using the CKD EPI equation.     This calculation has not been validated in all clinical situations.     eGFR's persistently <90 mL/min signify possible Chronic Kidney     Disease.  PROTIME-INR     Status: None   Collection Time    12/02/12  8:53 AM      Result Value Range   Prothrombin Time 13.0  11.6 - 15.2 seconds   INR 1.00  0.00 - 1.49  TYPE AND SCREEN     Status: None   Collection Time    12/02/12  9:00 AM      Result Value Range   ABO/RH(D) A POS     Antibody Screen NEG     Sample Expiration 12/16/2012      There is no weight on file to calculate BMI.   Imaging Review Plain radiographs demonstrate severe degenerative joint disease of the right hip(s). The bone quality appears to be good for age and reported activity level.  Assessment/Plan:  End stage arthritis, right hip(s)  The patient history, physical examination, clinical judgement of the provider and imaging studies are consistent with end stage degenerative joint disease of the right hip(s) and total hip arthroplasty is deemed  medically necessary. The treatment options including medical management, injection therapy, arthroscopy and arthroplasty were discussed at length. The risks and benefits of total hip arthroplasty were presented and reviewed. The risks due to aseptic loosening, infection, stiffness, dislocation/subluxation,  thromboembolic complications and other imponderables were discussed.  The patient acknowledged the explanation, agreed to proceed with the plan and consent was signed. Patient is being admitted for inpatient treatment for surgery, pain control, PT, OT, prophylactic antibiotics, VTE prophylaxis, progressive ambulation and ADL's and discharge planning.The patient is planning to be discharged home with home health services

## 2012-12-12 NOTE — Brief Op Note (Signed)
12/12/2012  12:45 PM  PATIENT:  Ashley Kaufman  62 y.o. female  PRE-OPERATIVE DIAGNOSIS:  DEGENERATIVE JOINT DISEASE  POST-OPERATIVE DIAGNOSIS:  degenertative joint disease right hip  PROCEDURE:  Procedure(s): TOTAL HIP ARTHROPLASTY ANTERIOR APPROACH (Right)  SURGEON:  Surgeon(s) and Role:    * Harvie Junior, MD - Primary  PHYSICIAN ASSISTANT:   ASSISTANTS: bethune   ANESTHESIA:   general  EBL:  Total I/O In: 2000 [I.V.:2000] Out: 350 [Urine:150; Blood:200]  BLOOD ADMINISTERED:none  DRAINS: none   LOCAL MEDICATIONS USED:  MARCAINE     SPECIMEN:  No Specimen  DISPOSITION OF SPECIMEN:  N/A  COUNTS:  YES  TOURNIQUET:  * No tourniquets in log *  DICTATION: .Other Dictation: Dictation Number [id number]  PLAN OF CARE: Admit to inpatient   PATIENT DISPOSITION:  PACU - hemodynamically stable.   Delay start of Pharmacological VTE agent (>24hrs) due to surgical blood loss or risk of bleeding: no

## 2012-12-12 NOTE — Anesthesia Postprocedure Evaluation (Signed)
  Anesthesia Post-op Note  Patient: Ashley Kaufman  Procedure(s) Performed: Procedure(s): TOTAL HIP ARTHROPLASTY ANTERIOR APPROACH (Right)  Patient Location: PACU  Anesthesia Type:General  Level of Consciousness: awake, alert , oriented and patient cooperative  Airway and Oxygen Therapy: Patient Spontanous Breathing and Patient connected to nasal cannula oxygen  Post-op Pain: mild  Post-op Assessment: Post-op Vital signs reviewed, Patient's Cardiovascular Status Stable, Respiratory Function Stable, Patent Airway, No signs of Nausea or vomiting and Pain level controlled  Post-op Vital Signs: Reviewed and stable  Complications: No apparent anesthesia complications

## 2012-12-12 NOTE — Transfer of Care (Signed)
Immediate Anesthesia Transfer of Care Note  Patient: Ashley Kaufman  Procedure(s) Performed: Procedure(s): TOTAL HIP ARTHROPLASTY ANTERIOR APPROACH (Right)  Patient Location: PACU  Anesthesia Type:General  Level of Consciousness: awake, alert  and oriented  Airway & Oxygen Therapy: Patient Spontanous Breathing and Patient connected to nasal cannula oxygen  Post-op Assessment: Report given to PACU RN, Post -op Vital signs reviewed and stable and Patient moving all extremities  Post vital signs: Reviewed and stable  Complications: No apparent anesthesia complications

## 2012-12-12 NOTE — Anesthesia Procedure Notes (Signed)
Procedure Name: Intubation Date/Time: 12/12/2012 10:26 AM Performed by: Orvilla Fus A Pre-anesthesia Checklist: Patient identified, Timeout performed, Suction available, Emergency Drugs available and Patient being monitored Patient Re-evaluated:Patient Re-evaluated prior to inductionOxygen Delivery Method: Circle system utilized Preoxygenation: Pre-oxygenation with 100% oxygen Intubation Type: IV induction Ventilation: Mask ventilation without difficulty Laryngoscope Size: Mac and 3 Grade View: Grade I Tube type: Oral Tube size: 7.0 mm Number of attempts: 1 Airway Equipment and Method: Stylet Placement Confirmation: ETT inserted through vocal cords under direct vision,  breath sounds checked- equal and bilateral and positive ETCO2 Secured at: 21 cm Tube secured with: Tape Dental Injury: Teeth and Oropharynx as per pre-operative assessment

## 2012-12-12 NOTE — Anesthesia Preprocedure Evaluation (Addendum)
Anesthesia Evaluation  Patient identified by MRN, date of birth, ID band Patient awake    Reviewed: Allergy & Precautions, H&P , NPO status , Patient's Chart, lab work & pertinent test results  History of Anesthesia Complications (+) AWARENESS UNDER ANESTHESIA and history of anesthetic complications (remembers OR lights)  Airway Mallampati: I TM Distance: >3 FB Neck ROM: Full    Dental  (+) Dental Advisory Given and Chipped   Pulmonary neg pulmonary ROS,  breath sounds clear to auscultation  Pulmonary exam normal       Cardiovascular negative cardio ROS  Rhythm:Regular Rate:Normal     Neuro/Psych Depression negative neurological ROS     GI/Hepatic negative GI ROS, (+)     substance abuse (h/o alcohol abuse )  alcohol use,   Endo/Other  negative endocrine ROS  Renal/GU negative Renal ROS     Musculoskeletal   Abdominal   Peds  Hematology negative hematology ROS (+)   Anesthesia Other Findings   Reproductive/Obstetrics                          Anesthesia Physical Anesthesia Plan  ASA: II  Anesthesia Plan: General   Post-op Pain Management:    Induction: Intravenous  Airway Management Planned: Oral ETT  Additional Equipment:   Intra-op Plan:   Post-operative Plan: Extubation in OR  Informed Consent: I have reviewed the patients History and Physical, chart, labs and discussed the procedure including the risks, benefits and alternatives for the proposed anesthesia with the patient or authorized representative who has indicated his/her understanding and acceptance.   Dental advisory given  Plan Discussed with: CRNA and Surgeon  Anesthesia Plan Comments: (Plan routine monitors, GETA)        Anesthesia Quick Evaluation

## 2012-12-13 LAB — CBC
HCT: 29.3 % — ABNORMAL LOW (ref 36.0–46.0)
Hemoglobin: 9.9 g/dL — ABNORMAL LOW (ref 12.0–15.0)
MCH: 30.7 pg (ref 26.0–34.0)
MCHC: 33.8 g/dL (ref 30.0–36.0)
MCV: 90.7 fL (ref 78.0–100.0)
Platelets: 176 10*3/uL (ref 150–400)
RBC: 3.23 MIL/uL — ABNORMAL LOW (ref 3.87–5.11)
RDW: 13.2 % (ref 11.5–15.5)
WBC: 10.6 10*3/uL — ABNORMAL HIGH (ref 4.0–10.5)

## 2012-12-13 MED ORDER — INFLUENZA VAC SPLIT QUAD 0.5 ML IM SUSP
0.5000 mL | INTRAMUSCULAR | Status: AC
  Administered 2012-12-14: 0.5 mL via INTRAMUSCULAR
  Filled 2012-12-13: qty 0.5

## 2012-12-13 NOTE — Progress Notes (Signed)
   CARE MANAGEMENT NOTE 12/13/2012  Patient:  Ashley Kaufman, Ashley Kaufman   Account Number:  [id number]  Date Initiated:  12/13/2012  Documentation initiated by:  [facility]  Subjective/Objective Assessment:   adm: right total hip arthroplasty     Action/Plan:   discharge planning   Anticipated DC Date:  12/15/2012   Anticipated DC Plan:  HOME W HOME HEALTH SERVICES      DC Planning Services  CM consult      [facility] Choice  HOME HEALTH   Choice offered to / List presented to:     DME arranged  3-N-1  WALKER - ROLLING      DME agency  [employer]     HH arranged  HH-2 PT      HH agency  [facility]   Status of service:  Completed, signed off Medicare Important Message given?   (If response is "NO", the following Medicare IM given date fields will be blank) Date Medicare IM given:   Date Additional Medicare IM given:    Discharge Disposition:  HOME W HOME HEALTH SERVICES  Per UR Regulation:    If discussed at Long Length of Stay Meetings, dates discussed:    Comments:  12/13/12 15:30 HHPT arranged prior to surgery.  [employer] provided rolling walker and 3n1; also arranged prior to surgery.  Pt schaeduled for discharge tomorrow, 12/14/12. [facility] notified of pt's pending discharge.  No other CM needs were communicated.  Freddy Jaksch, BSN, CM [phone].

## 2012-12-13 NOTE — Progress Notes (Signed)
Subjective: 1 Day Post-Op Procedure(s) (LRB): TOTAL HIP ARTHROPLASTY ANTERIOR APPROACH (Right) Patient reports pain as 4 on 0-10 scale.    Objective: Vital signs in last 24 hours: Temp:  [97.5 F (36.4 C)-98.6 F (37 C)] 98.6 F (37 C) (10/18 0643) Pulse Rate:  [67-109] 73 (10/18 0643) Resp:  [10-21] 18 (10/18 0643) BP: (101-122)/(48-67) 103/50 mmHg (10/18 0643) SpO2:  [91 %-100 %] 97 % (10/18 0643) Weight:  [57.153 kg (126 lb)] 57.153 kg (126 lb) (10/17 2000)  Intake/Output from previous day: 10/17 0701 - 10/18 0700 In: 5093.3 [P.O.:1030; I.V.:4063.3] Out: 1700 [Urine:1400; Blood:300] Intake/Output this shift: Total I/O In: 320 [P.O.:320] Out: 500 [Urine:500]   Recent Labs  12/13/12 0608  HGB 9.9*    Recent Labs  12/13/12 0608  WBC 10.6*  RBC 3.23*  HCT 29.3*  PLT 176   Right hip exam: Neurovascular intact Sensation intact distally Intact pulses distally Dorsiflexion/Plantar flexion intact Incision: dressing C/D/I Compartment soft  Assessment/Plan: 1 Day Post-Op Procedure(s) (LRB): TOTAL HIP ARTHROPLASTY ANTERIOR APPROACH (Right) Plan: Aspirin 325 mg twice daily for DVT prophylaxis. She has a flight of stairs at home. Up with therapy Plan for discharge tomorrow  Renne Platts G 12/13/2012, 10:33 AM

## 2012-12-13 NOTE — Evaluation (Signed)
Occupational Therapy Evaluation Patient Details Name: Ashley Kaufman MRN: [id number] DOB: [birth year] Today's Date: 12/13/2012 Time: 4098-1191 OT Time Calculation (min): 35 min  OT Assessment / Plan / Recommendation History of present illness Pt is a 62 y/o female admitted s/p R THA direct anterior approach.    Clinical Impression   Patient evaluated by Occupational Therapy with no further acute OT needs identified. All education has been completed and the patient has no further questions. Currently, pt is min A with BADLs.  Husband is very supportive See below for any follow-up Occupational Therapy or equipment needs. OT is signing off. Thank you for this referral.     OT Assessment  Patient does not need any further OT services    Follow Up Recommendations  No OT follow up;Supervision - Intermittent    Barriers to Discharge      Equipment Recommendations  None recommended by OT    Recommendations for Other Services    Frequency       Precautions / Restrictions Precautions Precautions: Fall Precaution Comments: Direct Anterior Restrictions Weight Bearing Restrictions: Yes RLE Weight Bearing: Weight bearing as tolerated   Pertinent Vitals/Pain     ADL  Eating/Feeding: Independent Where Assessed - Eating/Feeding: Chair;Bed level Grooming: Wash/dry hands;Supervision/safety Where Assessed - Grooming: Unsupported standing Upper Body Bathing: Set up Where Assessed - Upper Body Bathing: Unsupported sitting Lower Body Bathing: Minimal assistance Where Assessed - Lower Body Bathing: Supported sit to stand Upper Body Dressing: Set up Where Assessed - Upper Body Dressing: Unsupported sitting Lower Body Dressing: Moderate assistance Where Assessed - Lower Body Dressing: Supported sit to Pharmacist, hospital: Radiographer, therapeutic Method: Sit to Barista: Regular height toilet;Grab bars Toileting - Architect and Hygiene:  Supervision/safety Where Assessed - Engineer, mining and Hygiene: Standing Tub/Shower Transfer: Insurance risk surveyor Method: Science writer: Walk in Scientist, research (physical sciences) Used: Rolling walker Transfers/Ambulation Related to ADLs: supervision ADL Comments: Pt not yet able to access Rt. foot for LB ADLs due to pain, but husband will be available to assist as needed. Pt encouraged to continue attempts to access feet to increase flexibility    OT Diagnosis:    OT Problem List:   OT Treatment Interventions:     OT Goals(Current goals can be found in the care plan section)    Visit Information  Last OT Received On: 12/13/12 Assistance Needed: +1 History of Present Illness: Pt is a 62 y/o female admitted s/p R THA direct anterior approach.        Prior Functioning     Home Living Family/patient expects to be discharged to:: Private residence Living Arrangements: Spouse/significant other Available Help at Discharge: Family;Available 24 hours/day Type of Home: House Home Access: Stairs to enter [employer] of Steps: 3 Entrance Stairs-Rails: None Home Layout: Two level;1/2 bath on main level;Bed/bath upstairs Alternate Level Stairs-Number of Steps: 15 Alternate Level Stairs-Rails: Right (both sides at bottom of steps ) Home Equipment: Crutches Prior Function Level of Independence: Independent Communication Communication: No difficulties Dominant Hand: Right         Vision/Perception     Cognition  Cognition Arousal/Alertness: Awake/alert Behavior During Therapy: WFL for tasks assessed/performed Overall Cognitive Status: Within Functional Limits for tasks assessed    Extremity/Trunk Assessment Upper Extremity Assessment Upper Extremity Assessment: Overall WFL for tasks assessed Lower Extremity Assessment Lower Extremity Assessment: Defer to PT evaluation Cervical / Trunk Assessment Cervical / Trunk Assessment:  Normal     Mobility  Bed Mobility Bed Mobility: Supine to Sit;Sitting - Scoot to Edge of Bed;Sit to Supine Supine to Sit: 4: Min guard;HOB flat Sitting - Scoot to [employer] of Bed: 4: Min guard Sit to Supine: 4: Min guard;HOB flat Details for Bed Mobility Assistance: Increased time Transfers Transfers: Sit to Stand;Stand to Sit Sit to Stand: 5: Supervision;With upper extremity assist;From bed;From toilet Stand to Sit: 5: Supervision;With upper extremity assist;To bed;To toilet     Exercise     Balance Static Standing Balance Static Standing - Balance Support: No upper extremity supported Static Standing - Level of Assistance: 5: Stand by assistance Static Standing - Comment/# of Minutes: washing hands at sink   End of Session OT - End of Session Equipment Utilized During Treatment: Rolling walker Activity Tolerance: Patient tolerated treatment well Patient left: in bed;with call bell/phone within reach;with family/visitor present Nurse Communication: Patient requests pain meds  GO     Ashley Kaufman M 12/13/2012, 4:45 PM

## 2012-12-13 NOTE — Evaluation (Signed)
Physical Therapy Evaluation Patient Details Name: Ashley Kaufman MRN: [id number] DOB: Jun 08, 1950 Today's Date: 12/13/2012 Time: 4098-1191 PT Time Calculation (min): 28 min  PT Assessment / Plan / Recommendation History of Present Illness  Pt is a 62 y/o female admitted s/p R THA direct anterior approach.   Clinical Impression  This patient presents with decreased strength and AROM consistent with R THA on 12/12/2012. At the time of PT eval, pt required min assist for support of R LE during transfer to/from EOB, and frequent cueing for sequencing and safety awareness with the walker. This patient is appropriate for skilled PT interventions to address functional limitations, improve safety and independence with functional mobility, and return to PLOF.    PT Assessment  Patient needs continued PT services    Follow Up Recommendations  Home health PT    Does the patient have the potential to tolerate intense rehabilitation      Barriers to Discharge Inaccessible home environment      Equipment Recommendations  Rolling walker with 5" wheels;3in1 (PT)    Recommendations for Other Services     Frequency 7X/week    Precautions / Restrictions Precautions Precautions: Fall Precaution Comments: Direct Anterior Restrictions Weight Bearing Restrictions: Yes RLE Weight Bearing: Weight bearing as tolerated   Pertinent Vitals/Pain 5/10 at rest, 7/10 after ambulation. Nursing notified.      Mobility  Bed Mobility Bed Mobility: Supine to Sit;Sitting - Scoot to [employer] of Bed;Sit to Supine;Scooting to [facility] Supine to Sit: 4: Min assist;HOB flat Sitting - Scoot to Edge of Bed: 4: Min guard Sit to Supine: 4: Min assist;HOB flat Scooting to [facility]: 5: Supervision Details for Bed Mobility Assistance: VC's for sequencing and safety awareness. Min A for support of R LE during transfer to EOB. Transfers Transfers: Sit to Stand;Stand to Sit Sit to Stand: 4: Min guard;From bed;With upper extremity  assist Stand to Sit: 4: Min guard;To bed;With upper extremity assist Details for Transfer Assistance: VC's for hand placement on seated surface as well as sequencing/safety awareness. Ambulation/Gait Ambulation/Gait Assistance: 4: Min guard Ambulation Distance (Feet): 50 Feet Ambulation/Gait Assistance Details: VC's for sequencing with the RW. Occasional cues for improved posture, and WBAT status and for pt to keep heel down as she steps through. Gait Pattern: Step-to pattern;Decreased stride length;Trunk flexed Gait velocity: decreased    Exercises Total Joint Exercises Ankle Circles/Pumps: 10 reps;Both Quad Sets: 10 reps;Both   PT Diagnosis: Difficulty walking;Acute pain  PT Problem List: Decreased strength;Decreased range of motion;Decreased activity tolerance;Decreased balance;Decreased mobility;Decreased knowledge of use of DME;Decreased safety awareness;Pain PT Treatment Interventions: DME instruction;Gait training;Stair training;Functional mobility training;Therapeutic activities;Therapeutic exercise;Neuromuscular re-education;Patient/family education     PT Goals(Current goals can be found in the care plan section) Acute Rehab PT Goals Patient Stated Goal: To return home with husband PT Goal Formulation: With patient Time For Goal Achievement: 12/20/12 Potential to Achieve Goals: Good  Visit Information  Last PT Received On: 12/13/12 Assistance Needed: +1 History of Present Illness: Pt is a 62 y/o female admitted s/p R THA direct anterior approach.        Prior Functioning  Home Living Family/patient expects to be discharged to:: Private residence Living Arrangements: Spouse/significant other Available Help at Discharge: Family;Available 24 hours/day Type of Home: House Home Access: Stairs to enter [employer] of Steps: 3 Entrance Stairs-Rails: None Home Layout: Two level;1/2 bath on main level;Bed/bath upstairs Alternate Level Stairs-Number of Steps:  15 Alternate Level Stairs-Rails: Right (both sides at bottom of steps ) Home  Equipment: Crutches Prior Function Level of Independence: Independent Communication Communication: No difficulties Dominant Hand: Right    Cognition  Cognition Arousal/Alertness: Awake/alert Behavior During Therapy: WFL for tasks assessed/performed Overall Cognitive Status: Within Functional Limits for tasks assessed    Extremity/Trunk Assessment Upper Extremity Assessment Upper Extremity Assessment: Overall WFL for tasks assessed Cervical / Trunk Assessment Cervical / Trunk Assessment: Normal   Balance Balance Balance Assessed: Yes Static Sitting Balance Static Sitting - Balance Support: Feet supported;No upper extremity supported Static Sitting - Level of Assistance: 7: Independent Static Sitting - Comment/# of Minutes: 3 Static Standing Balance Static Standing - Balance Support: Bilateral upper extremity supported Static Standing - Level of Assistance: 5: Stand by assistance Static Standing - Comment/# of Minutes: 1  End of Session PT - End of Session Equipment Utilized During Treatment: Gait belt Activity Tolerance: Patient tolerated treatment well;Patient limited by pain Patient left: in bed;with call bell/phone within reach;with nursing/sitter in room Nurse Communication: Mobility status  GP     Ruthann Cancer 12/13/2012, 12:07 PM  Ruthann Cancer, PT, DPT Acute Rehabilitation Services [phone]

## 2012-12-14 LAB — CBC
HCT: 27.8 % — ABNORMAL LOW (ref 36.0–46.0)
Hemoglobin: 9.3 g/dL — ABNORMAL LOW (ref 12.0–15.0)
MCH: 30.5 pg (ref 26.0–34.0)
MCHC: 33.5 g/dL (ref 30.0–36.0)
MCV: 91.1 fL (ref 78.0–100.0)
Platelets: 165 10*3/uL (ref 150–400)
RBC: 3.05 MIL/uL — ABNORMAL LOW (ref 3.87–5.11)
RDW: 13.6 % (ref 11.5–15.5)
WBC: 10.8 10*3/uL — ABNORMAL HIGH (ref 4.0–10.5)

## 2012-12-14 NOTE — Progress Notes (Signed)
Utilization review completed. Isidoro Donning RN CCM Case Mgmt phone [phone]

## 2012-12-14 NOTE — Progress Notes (Signed)
Subjective: 2 Days Post-Op Procedure(s) (LRB): TOTAL HIP ARTHROPLASTY ANTERIOR APPROACH (Right) Patient reports pain as 3 on 0-10 scale.   Good progress with PT. Taking po/voiding ok  Objective: Vital signs in last 24 hours: Temp:  [97.6 F (36.4 C)-98.7 F (37.1 C)] 97.6 F (36.4 C) (10/19 0527) Pulse Rate:  [68-76] 68 (10/19 0527) Resp:  [16-18] 16 (10/19 0527) BP: (91-106)/(49-56) 91/53 mmHg (10/19 0527) SpO2:  [95 %-97 %] 95 % (10/19 0527)  Intake/Output from previous day: 10/18 0701 - 10/19 0700 In: 1140 [P.O.:1140] Out: 500 [Urine:500] Intake/Output this shift:     Recent Labs  12/13/12 0608 12/14/12 0350  HGB 9.9* 9.3*    Recent Labs  12/13/12 0608 12/14/12 0350  WBC 10.6* 10.8*  RBC 3.23* 3.05*  HCT 29.3* 27.8*  PLT 176 165   Right hip exam: Neurovascular intact Sensation intact distally Intact pulses distally Dorsiflexion/Plantar flexion intact Incision: dressing C/D/I Compartment soft  Assessment/Plan: 2 Days Post-Op Procedure(s) (LRB): TOTAL HIP ARTHROPLASTY ANTERIOR APPROACH (Right) Plan: Discharge home with home health  Kinslie Hove G 12/14/2012, 11:40 AM

## 2012-12-14 NOTE — Progress Notes (Signed)
Physical Therapy Treatment Patient Details Name: Ashley Kaufman MRN: [id number] DOB: Mar 18, 1950 Today's Date: 12/14/2012 Time: 0742-0820 PT Time Calculation (min): 38 min  PT Assessment / Plan / Recommendation  History of Present Illness Pt is a 62 y/o female admitted s/p R THA direct anterior approach.    PT Comments   Pt progressing well with therapy.  Increased ambulation distance & completed stair training this session.  Pt safe to d/c home from PT standpoint when MD feels medically ready.     Follow Up Recommendations  Home health PT     Does the patient have the potential to tolerate intense rehabilitation     Barriers to Discharge        Equipment Recommendations  Rolling walker with 5" wheels;3in1 (PT)    Recommendations for Other Services    Frequency 7X/week   Progress towards PT Goals Progress towards PT goals: Progressing toward goals  Plan Current plan remains appropriate    Precautions / Restrictions Precautions Precautions: Fall Precaution Comments: Direct Anterior Restrictions RLE Weight Bearing: Weight bearing as tolerated   Pertinent Vitals/Pain 6/10 Rt hip.  Repositioned for comfort.  Ice applied.  RN notified for pain medication    Mobility  Bed Mobility Bed Mobility: Supine to Sit;Sitting - Scoot to Edge of Bed Supine to Sit: HOB flat;6: Modified independent (Device/Increase time) Sitting - Scoot to Edge of Bed: 6: Modified independent (Device/Increase time) Details for Bed Mobility Assistance: Incr time but able to complete without physical (A).   Transfers Transfers: Sit to Stand;Stand to Sit Sit to Stand: 5: Supervision;With upper extremity assist;From bed;With armrests;From chair/3-in-1 Stand to Sit: 5: Supervision;With upper extremity assist;With armrests;To chair/3-in-1 Details for Transfer Assistance: Performed 4x's throughout session.  Demonstrates safe hand placement & technique.   Ambulation/Gait Ambulation/Gait Assistance: 4: Min  guard Ambulation Distance (Feet): 225 Feet (150' + 75) Assistive device: Rolling walker Ambulation/Gait Assistance Details: cues for sequencing initially.  Cues for increased heel strike.  Pt with step-to pattern initially but progressing to step-through Gait Pattern: Step-to pattern;Step-through pattern;Decreased weight shift to left Gait velocity: decreased Stairs: Yes Stairs Assistance: 4: Min assist;4: Min guard Stairs Assistance Details (indicate cue type and reason): (A) to stabilize RW with backwards technique & (A) provided via L HHA with forwards technique.   Practiced 2 different ways: backwards with RW to mimic getting inside home & forwards with rail + HHA to mimic going up flight of steps.   Stair Management Technique: One rail Right;Step to pattern;Backwards;Forwards;With walker Number of Stairs:  (3 + 10) Wheelchair Mobility Wheelchair Mobility: No    Exercises Total Joint Exercises Ankle Circles/Pumps: AROM;Both;10 reps Heel Slides: AAROM;Right;10 reps;Strengthening     PT Goals (current goals can now be found in the care plan section) Acute Rehab PT Goals PT Goal Formulation: With patient Time For Goal Achievement: 12/20/12 Potential to Achieve Goals: Good  Visit Information  Last PT Received On: 12/14/12 Assistance Needed: +1 History of Present Illness: Pt is a 62 y/o female admitted s/p R THA direct anterior approach.     Subjective Data      Cognition  Cognition Arousal/Alertness: Awake/alert Behavior During Therapy: WFL for tasks assessed/performed Overall Cognitive Status: Within Functional Limits for tasks assessed    Balance     End of Session PT - End of Session Equipment Utilized During Treatment: Gait belt Activity Tolerance: Patient tolerated treatment well Patient left: in chair;with call bell/phone within reach Nurse Communication: Mobility status   GP  Lara Mulch 12/14/2012, 10:21 AM   Verdell Face,  PTA [phone] 12/14/2012

## 2012-12-14 NOTE — Discharge Summary (Signed)
Patient ID: MARRISSA DAI MRN: [id number] DOB/AGE: 1950/07/09 62 y.o.  Admit date: 12/12/2012 Discharge date: 12/14/2012  Admission Diagnoses:  Principal Problem:   Osteoarthritis of right hip   Discharge Diagnoses:  Same  Past Medical History  Diagnosis Date  . Complication of anesthesia     remember waking up during surgery  . Anxiety   . Depression   . Seasonal allergies Sept last 2 weeks    cold for 2 weeks, took OTC meds  . Headache(784.0)     sinus  . Arthritis     Surgeries: Procedure(s):RIGHT TOTAL HIP ARTHROPLASTY ANTERIOR APPROACH on 12/12/2012   Discharged Condition: Improved  Hospital Course: TABRINA ESTY is an 62 y.o. female who was admitted 12/12/2012 for operative treatment ofOsteoarthritis of right hip. Patient has severe unremitting pain that affects sleep, daily activities, and work/hobbies. After pre-op clearance the patient was taken to the operating room on 12/12/2012 and underwent  Procedure(s):RIGHT TOTAL HIP ARTHROPLASTY ANTERIOR APPROACH.    Patient was given perioperative antibiotics: Anti-infectives   Start     Dose/Rate Route Frequency Ordered Stop   12/12/12 1600  ceFAZolin (ANCEF) IVPB 2 g/50 mL premix     2 g 100 mL/hr over 30 Minutes Intravenous Every 6 hours 12/12/12 1441 12/12/12 2249   12/12/12 0600  ceFAZolin (ANCEF) IVPB 2 g/50 mL premix     2 g 100 mL/hr over 30 Minutes Intravenous On call to O.R. 12/11/12 1411 12/12/12 1028       Patient was given sequential compression devices, early ambulation, and chemoprophylaxis to prevent DVT.  Patient benefited maximally from hospital stay and there were no complications.    Recent vital signs: Patient Vitals for the past 24 hrs:  BP Temp Temp src Pulse Resp SpO2  12/14/12 0527 91/53 mmHg 97.6 F (36.4 C) Oral 68 16 95 %  12/13/12 2017 106/56 mmHg 98.1 F (36.7 C) Oral 70 16 97 %  12/13/12 2000 - - - - 16 97 %  12/13/12 1600 - - - - 16 -  12/13/12 1416 98/49 mmHg 98.7 F  (37.1 C) Oral 76 18 97 %  12/13/12 1200 - - - - 16 -     Recent laboratory studies:  Recent Labs  12/13/12 0608 12/14/12 0350  WBC 10.6* 10.8*  HGB 9.9* 9.3*  HCT 29.3* 27.8*  PLT 176 165     Discharge Medications:     Medication List    STOP taking these medications       meloxicam 15 MG tablet  Commonly known as:  MOBIC     traMADol 50 MG tablet  Commonly known as:  ULTRAM      TAKE these medications       aspirin EC 325 MG tablet  Take 1 tablet (325 mg total) by mouth 2 (two) times daily after a meal. Take until one month post op.     BILBERRY PO  Take 250 mg by mouth daily with supper.     estradiol 0.1 MG/GM vaginal cream  Commonly known as:  ESTRACE  Place 2 g vaginally 2 (two) times a week. Sunday and Wednesday     methocarbamol 750 MG tablet  Commonly known as:  ROBAXIN-750  Take 1 tablet (750 mg total) by mouth 3 (three) times daily. Prn spasm.     MOVE FREE PO  Take 1 tablet by mouth 2 (two) times daily.     multivitamin with minerals Tabs tablet  Take 1 tablet by mouth  daily.     OVER THE COUNTER MEDICATION  Take 1 tablet by mouth daily with supper. Tru-nature Vision complex with 25 mg lutein     oxyCODONE-acetaminophen 5-325 MG per tablet  Commonly known as:  PERCOCET/ROXICET  Take 1-2 tablets by mouth every 6 (six) hours as needed for pain.     PARoxetine 40 MG tablet  Commonly known as:  PAXIL  Take 40 mg by mouth at bedtime.     SYSTANE BALANCE OP  Place 1 drop into both eyes daily.     Vitamin D 2000 UNITS tablet  Take 2,000 Units by mouth at bedtime.        Diagnostic Studies: Dg Chest 2 View  12/02/2012   CLINICAL DATA:  Preop for right hip replacement  EXAM: CHEST  2 VIEW  COMPARISON:  Chest x-ray of 09/15/2008  FINDINGS: No active infiltrate or effusion is seen. Mediastinal contours appear normal. The heart is within normal limits in size. No bony abnormality is noted, other than degenerative change in the mid to lower  thoracic spine.  IMPRESSION: No active lung disease.   Electronically Signed   By: Dwyane Dee M.D.   On: 12/02/2012 09:12   Dg Hip Operative Right  12/12/2012   CLINICAL DATA:  Right total hip replacement.  EXAM: DG OPERATIVE RIGHT HIP  TECHNIQUE: A single spot fluoroscopic AP image of the right hip is submitted.  COMPARISON:  None.  FINDINGS: Two intraoperative spot images demonstrate changes of right hip replacement. Normal AP alignment. No visible complicating feature.  IMPRESSION: Right hip replacement without visible complicating feature.   Electronically Signed   By: Charlett Nose M.D.   On: 12/12/2012 15:25   Dg Pelvis Portable  12/12/2012   CLINICAL DATA:  Right hip arthroplasty  EXAM: PORTABLE PELVIS  COMPARISON:  CT, 08/28/2008  FINDINGS: Right hip prosthesis is well aligned, with the femoral and acetabular components well seated. There is no acute fracture or evidence of an operative complication.  IMPRESSION: Right hip prosthesis is well-seated and aligned   Electronically Signed   By: Amie Portland M.D.   On: 12/12/2012 14:21   Dg Hip Portable 1 View Right  12/12/2012   CLINICAL DATA:  Postop.  EXAM: PORTABLE RIGHT HIP - 1 VIEW  COMPARISON:  Pelvic films same date.  FINDINGS: Total right hip prosthesis appears in satisfactory position without complication noted.  IMPRESSION: Total right hip prosthesis appears in satisfactory position without complication noted.   Electronically Signed   By: Bridgett Larsson M.D.   On: 12/12/2012 14:20    Disposition: HOME      Discharge Orders   Future Orders Complete By Expires   Call MD / Call 911  As directed    Comments:     If you experience chest pain or shortness of breath, CALL 911 and be transported to the hospital emergency room.  If you develope a fever above 101 F, pus (white drainage) or increased drainage or redness at the wound, or calf pain, call your surgeon's office.   Constipation Prevention  As directed    Comments:     Drink  plenty of fluids.  Prune juice may be helpful.  You may use a stool softener, such as Colace (over the counter) 100 mg twice a day.  Use MiraLax (over the counter) for constipation as needed.   Diet general  As directed    Increase activity slowly as tolerated  As directed    Weight bearing  as tolerated  As directed    Questions:     Laterality:     Extremity:        Follow-up Information   Follow up with GRAVES,JOHN L, MD. Schedule an appointment as soon as possible for a visit in 2 weeks.   Specialty:  Orthopedic Surgery   Contact information:   [address] [place] [address] [phone]       Follow up with [facility]. (home health physical therapy)    Contact information:   [address] [place] [address] [phone]        Signed: Matthew Folks 12/14/2012, 11:44 AM

## 2012-12-15 ENCOUNTER — Encounter (HOSPITAL_COMMUNITY): Payer: Self-pay | Admitting: Orthopedic Surgery

## 2013-01-19 ENCOUNTER — Other Ambulatory Visit: Payer: Self-pay | Admitting: Family Medicine

## 2013-01-19 ENCOUNTER — Other Ambulatory Visit (HOSPITAL_COMMUNITY)
Admission: RE | Admit: 2013-01-19 | Discharge: 2013-01-19 | Disposition: A | Discharge status: Home / Self Care | Payer: 59 | Source: Ambulatory Visit | Attending: Family Medicine | Admitting: Family Medicine

## 2013-01-19 DIAGNOSIS — Z01419 Encounter for gynecological examination (general) (routine) without abnormal findings: Secondary | ICD-10-CM | POA: Insufficient documentation

## 2013-09-02 ENCOUNTER — Other Ambulatory Visit: Payer: Self-pay | Admitting: Orthopedic Surgery

## 2013-09-02 DIAGNOSIS — R609 Edema, unspecified: Secondary | ICD-10-CM

## 2013-09-02 DIAGNOSIS — M25551 Pain in right hip: Secondary | ICD-10-CM

## 2013-09-10 ENCOUNTER — Ambulatory Visit
Admission: RE | Admit: 2013-09-10 | Discharge: 2013-09-10 | Disposition: A | Discharge status: Home / Self Care | Payer: 59 | Source: Ambulatory Visit | Attending: Orthopedic Surgery | Admitting: Orthopedic Surgery

## 2013-09-10 DIAGNOSIS — M25551 Pain in right hip: Secondary | ICD-10-CM

## 2013-09-10 DIAGNOSIS — R609 Edema, unspecified: Secondary | ICD-10-CM

## 2013-12-16 ENCOUNTER — Other Ambulatory Visit: Payer: Self-pay

## 2013-12-16 DIAGNOSIS — Z1231 Encounter for screening mammogram for malignant neoplasm of breast: Secondary | ICD-10-CM

## 2014-01-05 ENCOUNTER — Ambulatory Visit
Admission: RE | Admit: 2014-01-05 | Discharge: 2014-01-05 | Disposition: A | Discharge status: Home / Self Care | Payer: 59 | Source: Ambulatory Visit

## 2014-01-05 DIAGNOSIS — Z1231 Encounter for screening mammogram for malignant neoplasm of breast: Secondary | ICD-10-CM

## 2014-12-17 ENCOUNTER — Other Ambulatory Visit: Payer: Self-pay | Admitting: Family Medicine

## 2014-12-17 ENCOUNTER — Other Ambulatory Visit (HOSPITAL_COMMUNITY)
Admission: RE | Admit: 2014-12-17 | Discharge: 2014-12-17 | Disposition: A | Discharge status: Home / Self Care | Payer: 59 | Source: Ambulatory Visit | Attending: Family Medicine | Admitting: Family Medicine

## 2014-12-17 DIAGNOSIS — Z124 Encounter for screening for malignant neoplasm of cervix: Secondary | ICD-10-CM | POA: Insufficient documentation

## 2014-12-17 DIAGNOSIS — Z1151 Encounter for screening for human papillomavirus (HPV): Secondary | ICD-10-CM | POA: Diagnosis not present

## 2014-12-20 LAB — CYTOLOGY - PAP

## 2015-07-01 ENCOUNTER — Other Ambulatory Visit: Payer: Self-pay | Admitting: Otolaryngology

## 2015-07-01 DIAGNOSIS — J329 Chronic sinusitis, unspecified: Secondary | ICD-10-CM

## 2015-07-07 ENCOUNTER — Ambulatory Visit
Admission: RE | Admit: 2015-07-07 | Discharge: 2015-07-07 | Disposition: A | Discharge status: Home / Self Care | Payer: Medicare Other | Source: Ambulatory Visit | Attending: Otolaryngology | Admitting: Otolaryngology

## 2015-07-07 DIAGNOSIS — J329 Chronic sinusitis, unspecified: Secondary | ICD-10-CM

## 2015-08-01 ENCOUNTER — Other Ambulatory Visit (HOSPITAL_COMMUNITY): Payer: Self-pay | Admitting: Psychiatry

## 2016-01-18 ENCOUNTER — Other Ambulatory Visit: Payer: Self-pay | Admitting: Family Medicine

## 2016-01-18 DIAGNOSIS — Z1231 Encounter for screening mammogram for malignant neoplasm of breast: Secondary | ICD-10-CM

## 2016-02-13 ENCOUNTER — Ambulatory Visit
Admission: RE | Admit: 2016-02-13 | Discharge: 2016-02-13 | Disposition: A | Discharge status: Home / Self Care | Payer: Medicare Other | Source: Ambulatory Visit

## 2016-02-13 ENCOUNTER — Ambulatory Visit
Admission: RE | Admit: 2016-02-13 | Discharge: 2016-02-13 | Disposition: A | Discharge status: Home / Self Care | Payer: Medicare Other | Source: Ambulatory Visit | Attending: Family Medicine | Admitting: Family Medicine

## 2016-02-13 ENCOUNTER — Other Ambulatory Visit: Payer: Self-pay | Admitting: Family Medicine

## 2016-02-13 DIAGNOSIS — Z1231 Encounter for screening mammogram for malignant neoplasm of breast: Secondary | ICD-10-CM

## 2017-07-15 ENCOUNTER — Other Ambulatory Visit: Payer: Self-pay | Admitting: Family Medicine

## 2017-07-15 DIAGNOSIS — Z1231 Encounter for screening mammogram for malignant neoplasm of breast: Secondary | ICD-10-CM

## 2017-07-17 ENCOUNTER — Other Ambulatory Visit: Payer: Self-pay | Admitting: Family Medicine

## 2017-07-17 DIAGNOSIS — N644 Mastodynia: Secondary | ICD-10-CM

## 2017-07-19 ENCOUNTER — Ambulatory Visit
Admission: RE | Admit: 2017-07-19 | Discharge: 2017-07-19 | Disposition: A | Discharge status: Home / Self Care | Payer: Medicare Other | Source: Ambulatory Visit | Attending: Family Medicine | Admitting: Family Medicine

## 2017-07-19 DIAGNOSIS — N644 Mastodynia: Secondary | ICD-10-CM

## 2019-09-28 ENCOUNTER — Other Ambulatory Visit: Payer: Self-pay | Admitting: Family Medicine

## 2019-09-28 DIAGNOSIS — Z1231 Encounter for screening mammogram for malignant neoplasm of breast: Secondary | ICD-10-CM

## 2019-10-14 ENCOUNTER — Other Ambulatory Visit: Payer: Self-pay

## 2019-10-14 ENCOUNTER — Ambulatory Visit
Admission: RE | Admit: 2019-10-14 | Discharge: 2019-10-14 | Disposition: A | Discharge status: Home / Self Care | Payer: Medicare Other | Source: Ambulatory Visit | Attending: Family Medicine | Admitting: Family Medicine

## 2019-10-14 DIAGNOSIS — Z1231 Encounter for screening mammogram for malignant neoplasm of breast: Secondary | ICD-10-CM

## 2021-09-27 IMAGING — MG DIGITAL SCREENING BILAT W/ TOMO W/ CAD
8 series · 9 of 24 positions shown · non-contrast
Comparison: Previous exam(s).

CLINICAL DATA: Screening.

EXAM:
DIGITAL SCREENING BILATERAL MAMMOGRAM WITH TOMO AND CAD

[R CC synth-2D]
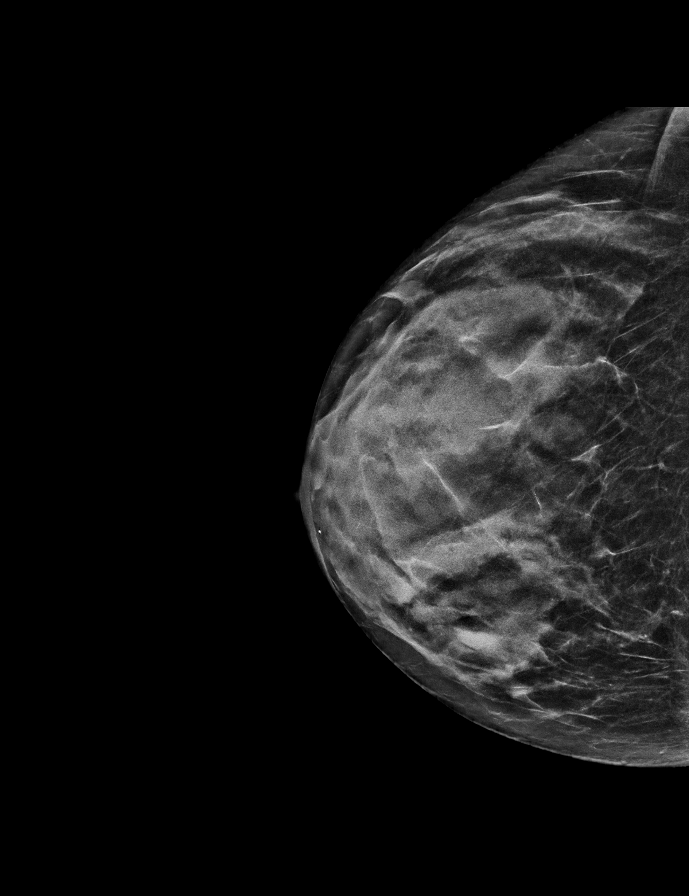

[R MLO synth-2D]
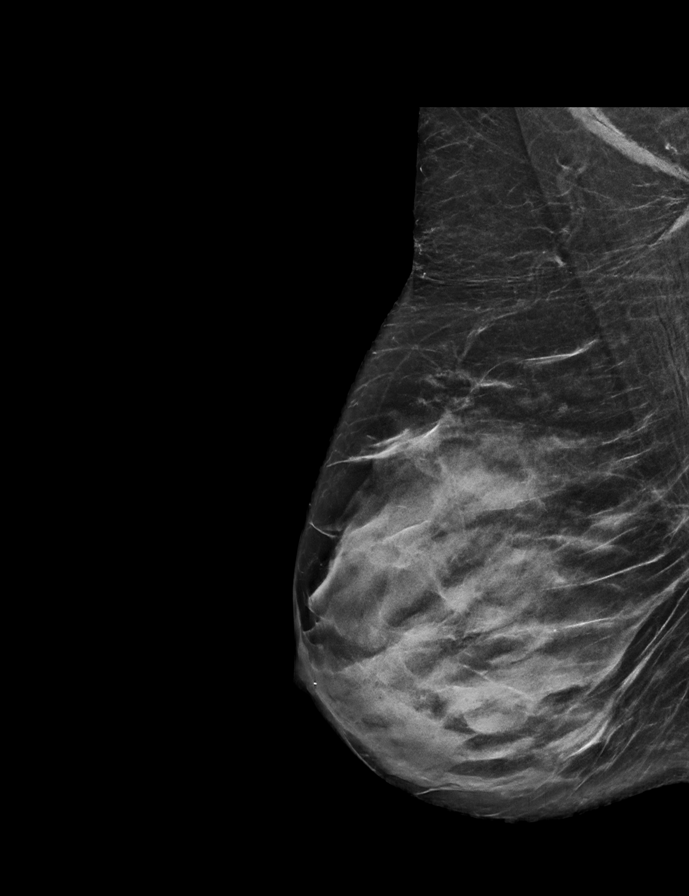

[L MLO synth-2D]
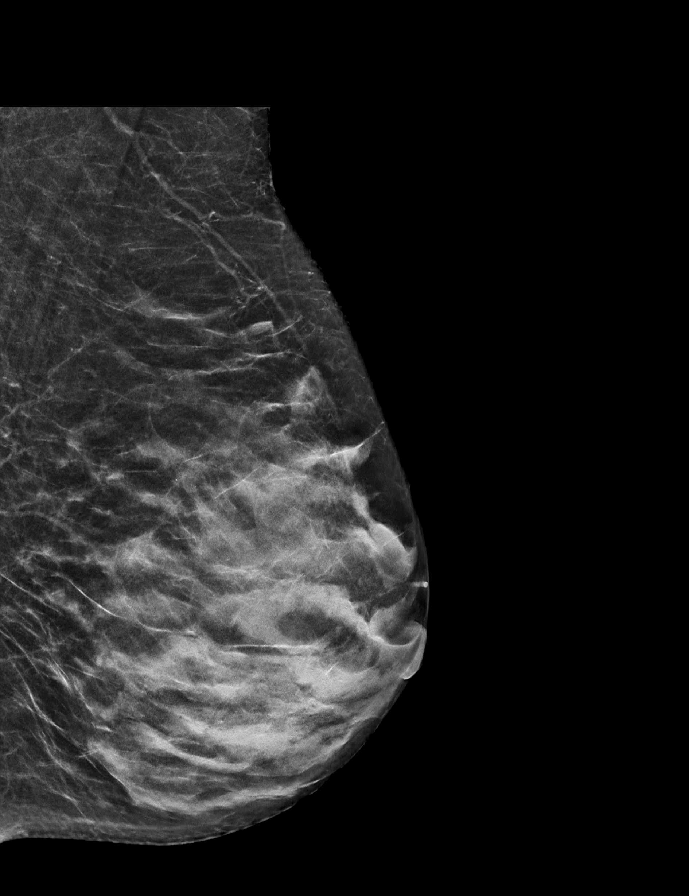

[L CC synth-2D]
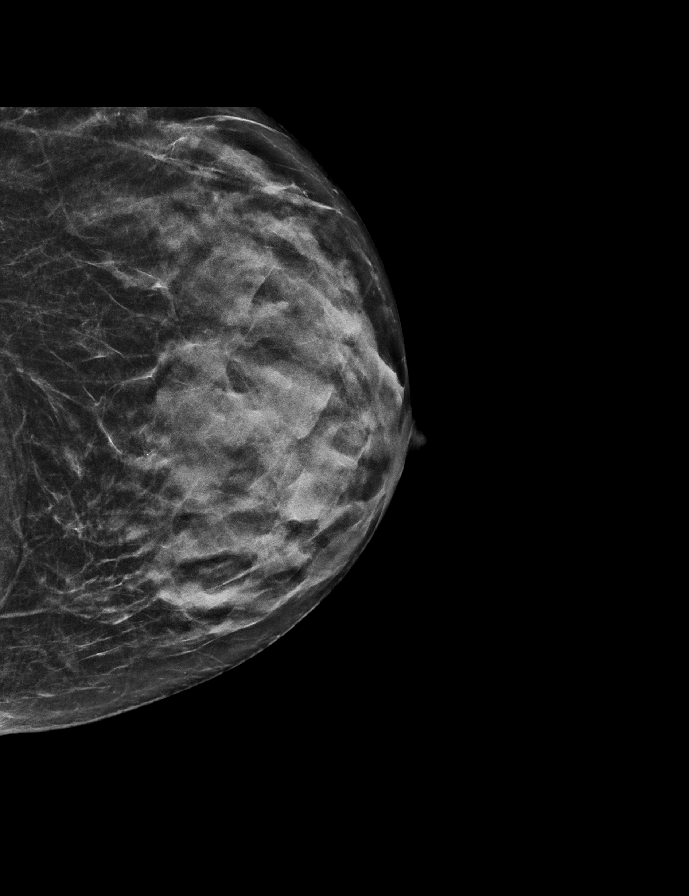

[R CC tomo · 2 of 53 frames shown]
[frame 18/53]
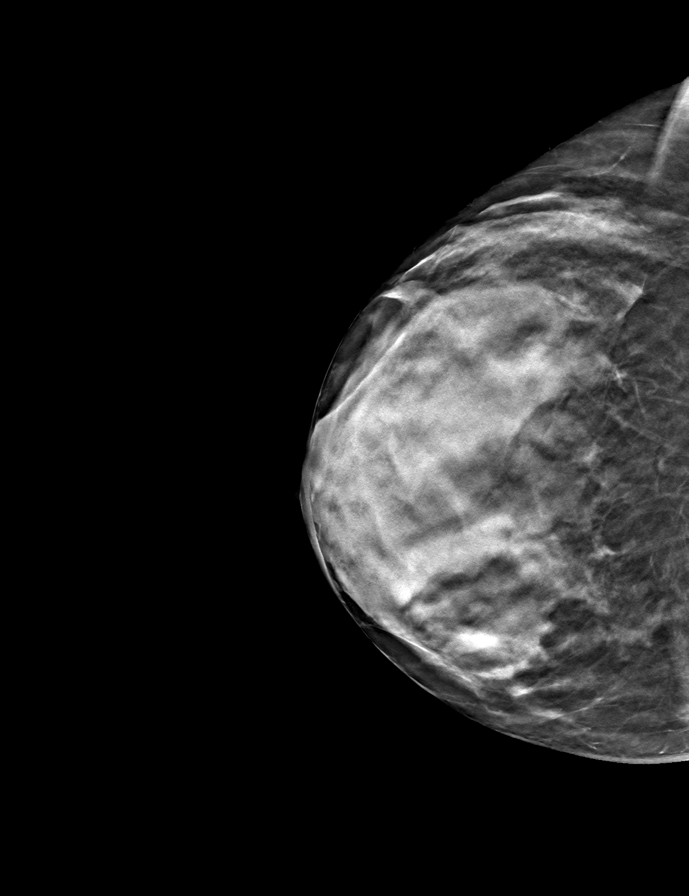
[frame 27/53]
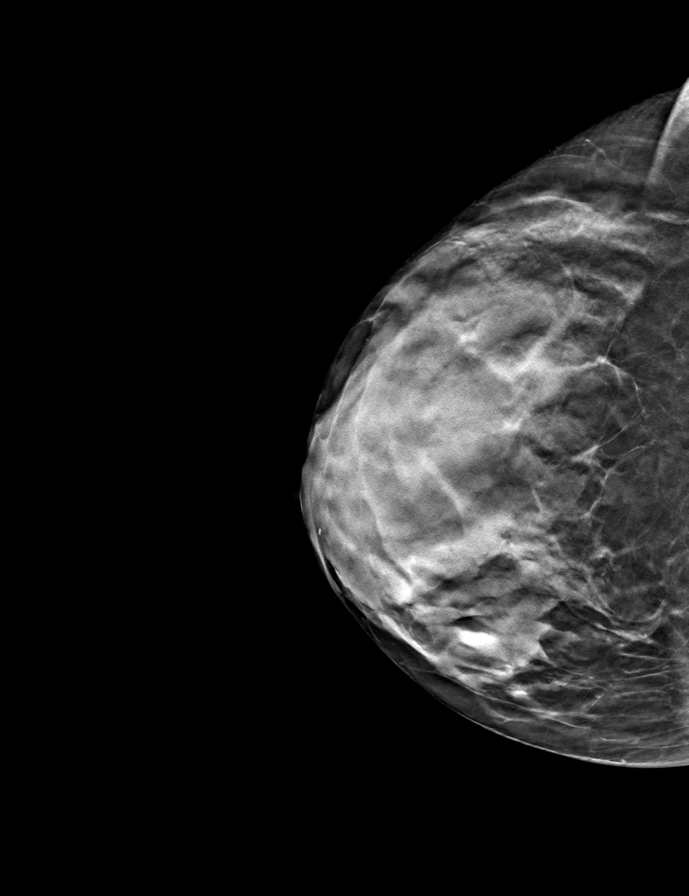

[L CC tomo · tomo slice 25/50.0]
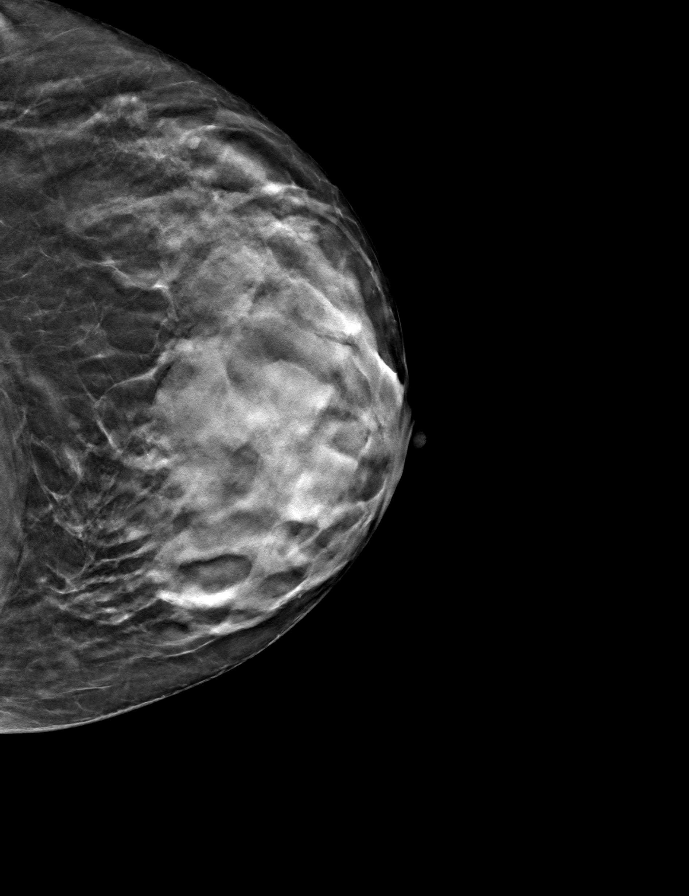

[L MLO tomo · tomo slice 27/53.0]
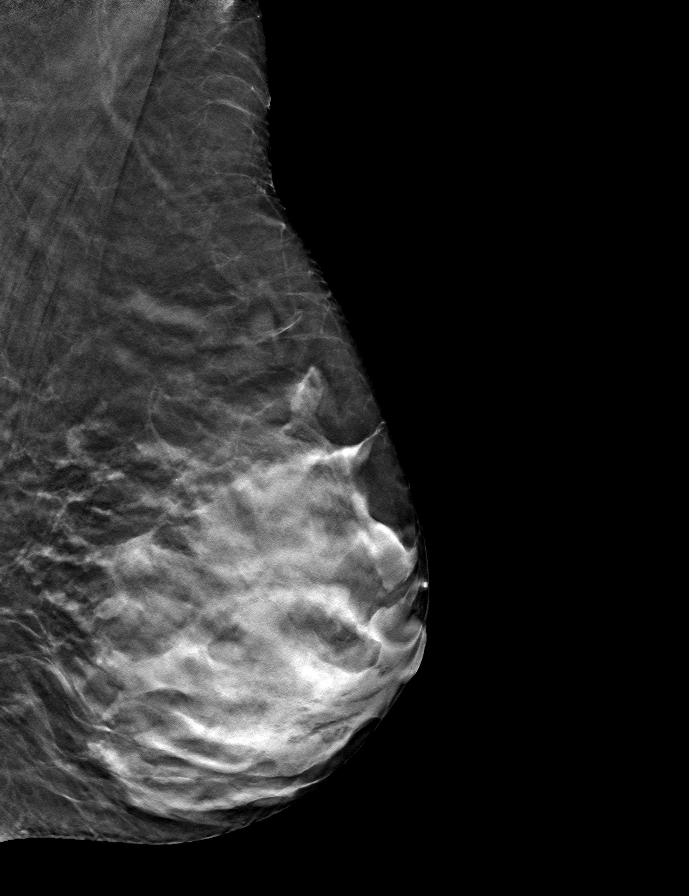

[R MLO tomo · tomo slice 31/60.0]
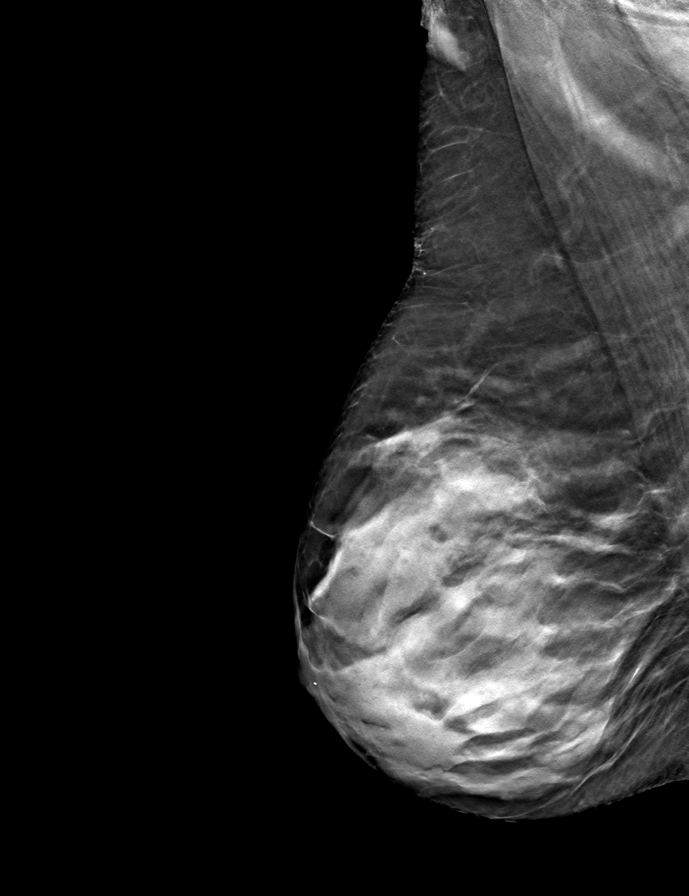

[9 of 24 positions shown; findings below may reference images not displayed]

ACR Breast Density Category d: The breast tissue is extremely dense,
which lowers the sensitivity of mammography
FINDINGS: There are no findings suspicious for malignancy. Images were
processed with CAD.
IMPRESSION: No mammographic evidence of malignancy. A result letter of this
screening mammogram will be mailed directly to the patient.

RECOMMENDATION:
Screening mammogram in one year. (Code:WO-[id number])

BI-RADS CATEGORY  1: Negative.
